# Patient Record
Sex: Female | Born: 1960 | Race: White | Hispanic: Refuse to answer | Marital: Married | Smoking: Former smoker
Health system: Southern US, Community
[De-identification: ages and names within clinical notes are randomized; demographics above are authoritative.]

## PROBLEM LIST (undated history)

## (undated) DIAGNOSIS — E7800 Pure hypercholesterolemia, unspecified: Secondary | ICD-10-CM

## (undated) DIAGNOSIS — E119 Type 2 diabetes mellitus without complications: Secondary | ICD-10-CM

## (undated) DIAGNOSIS — T7840XA Allergy, unspecified, initial encounter: Secondary | ICD-10-CM

## (undated) HISTORY — DX: Allergy, unspecified, initial encounter: T78.40XA

---

## 1983-09-06 HISTORY — PX: LAPAROSCOPY: SHX197

## 2017-08-01 ENCOUNTER — Other Ambulatory Visit: Payer: Self-pay

## 2017-08-01 ENCOUNTER — Ambulatory Visit
Admission: EM | Admit: 2017-08-01 | Discharge: 2017-08-01 | Disposition: A | Discharge status: Home / Self Care | Payer: BLUE CROSS/BLUE SHIELD | Attending: Emergency Medicine | Admitting: Emergency Medicine

## 2017-08-01 ENCOUNTER — Ambulatory Visit (INDEPENDENT_AMBULATORY_CARE_PROVIDER_SITE_OTHER): Payer: BLUE CROSS/BLUE SHIELD

## 2017-08-01 DIAGNOSIS — R05 Cough: Secondary | ICD-10-CM

## 2017-08-01 DIAGNOSIS — J209 Acute bronchitis, unspecified: Secondary | ICD-10-CM

## 2017-08-01 DIAGNOSIS — J0100 Acute maxillary sinusitis, unspecified: Secondary | ICD-10-CM | POA: Diagnosis not present

## 2017-08-01 MED ORDER — IPRATROPIUM-ALBUTEROL 0.5-2.5 (3) MG/3ML IN SOLN
3.0000 mL | Freq: Once | RESPIRATORY_TRACT | Status: AC
  Administered 2017-08-01: 3 mL via RESPIRATORY_TRACT

## 2017-08-01 MED ORDER — AEROCHAMBER PLUS MISC: 2 refills | Status: DC

## 2017-08-01 MED ORDER — DOXYCYCLINE HYCLATE 100 MG PO CAPS: 100.0000 mg | ORAL_CAPSULE | Freq: Two times a day (BID) | ORAL | 0 refills | Status: AC

## 2017-08-01 MED ORDER — ALBUTEROL SULFATE HFA 108 (90 BASE) MCG/ACT IN AERS: 1.0000 | INHALATION_SPRAY | Freq: Four times a day (QID) | RESPIRATORY_TRACT | 0 refills | Status: DC | PRN

## 2017-08-01 MED ORDER — BENZONATATE 200 MG PO CAPS: 200.0000 mg | ORAL_CAPSULE | Freq: Three times a day (TID) | ORAL | 0 refills | Status: DC | PRN

## 2017-08-01 MED ORDER — FLUTICASONE PROPIONATE 50 MCG/ACT NA SUSP: 2.0000 | Freq: Every day | NASAL | 0 refills | Status: DC

## 2017-08-01 NOTE — ED Triage Notes (Signed)
Patient complains of cough and heavy mucus productivity. Patient states that symptoms started around 1 month ago and she thought she was improving and worsened again around 1 week ago. Patient states that she feels like she has been wheezing recently.

## 2017-08-01 NOTE — Discharge Instructions (Signed)
Take the medication as written. Return if you get worse, have a fever >100.4, or for any concerns. You may take 600 mg of motrin with 1 gram of tylenol up to 3-4 times a day as needed for pain. This is an effective combination for pain. Use a neti pot or the NeilMed sinus rinse as often as you want to to reduce nasal congestion. Follow the directions on the box.   Here is a list of primary care providers who are taking new patients:  Dr. Elizabeth Sauereanna Jones, Dr. Schuyler AmorWilliam Plonk [address] [place] [address] [phone]  [facility] [place] [address]  [place] [address]  [phone]  [facility] [address]  [place], [place] [address] [phone]  [facility] [place] [address]  [phone] [place], [place] [address]  Here are clinics/ other resources who will see you if you do not have insurance. Some have certain criteria that you must meet. Call them and find out what they are:  [facility]: [address] [place], [place] [address] Phone: [phone] Hours: First and Third Saturdays of each Month, 9 a.m. - 1 p.m.  [facility]: [address], [place], [place] [address] Phone: [phone] Hours: Tuesday, 4 p.m. - 8 p.m. Thursday, 1 p.m. - 8 p.m. Wednesday, 9 a.m. - [facility]  [facility] [address], [place], [place] [address] Phone: [phone] Pharmacy Phone Number: [phone] Dental Phone Number: [phone] [facility] Insurance Help: [phone]  Dental Hours: Monday - Thursday, 8 a.m. - 6 p.m.  [facility] [address] [place], [place] [address] Phone: [phone] Pharmacy Phone Number: [phone] [facility] Insurance Help: [phone]  [facility] [address] [place], [place] [address] Phone: [phone] Pharmacy Phone Number: [phone] [facility] Insurance Help: [phone]  [facility] [address]  [place], [place] [address] Phone: [phone] [facility] Insurance Help: [phone]   [facility] [address] [place], [place] [address] Phone: [phone]  Go to www.goodrx.com to look up your medications. This will give you a list of where you can find your prescriptions at the most affordable prices. Or ask the pharmacist what the cash price is, or if they have any other discount programs available to help make your medication more affordable. This can be less expensive than what you would pay with insurance.

## 2017-08-01 NOTE — ED Provider Notes (Signed)
HPI  SUBJECTIVE:  Cassidy Wood is a 56 y.o. female who presents with 1 month of chest congestion, cough, nasal congestion, rhinorrhea, postnasal drip.  Patient states that she was getting better and symptoms had almost completely resolved and then got worse several days ago.  She reports greenish yellowish nasal discharge and sputum.  She reports increased sputum production.  She reports wheezing with coughing, chest soreness, rhinorrhea, postnasal drip.  No hemoptysis, fevers, other chest pain, shortness of breath.  No upper dental pain, sinus pain or pressure.  No unintentional weight loss, unintentional weight gain, lower extremity edema, nocturia, orthopnea, PND, abdominal pain.  No dyspnea on exertion, GERD symptoms.  She tried a nasal strip, DayQuil, Vicks, cough drops which were working up until yesterday.  No aggravating factors.  She is a smoker, has been smoking for 30 years.  No history of GERD, asthma, emphysema, COPD, CHF, diabetes, hypertension.  She does have history of sinusitis.  [place] In [place].  States that they live here 6-9 months out of the year.  History reviewed. No pertinent past medical history.  Past Surgical History:  Procedure Laterality Date  . LAPAROSCOPY  1985    Family History  Problem Relation Age of Onset  . Diabetes Mother   . COPD Father     Social History   Tobacco Use  . Smoking status: Current Every Day Smoker    Packs/day: 0.10  . Smokeless tobacco: Never Used  Substance Use Topics  . Alcohol use: No    Frequency: Never  . Drug use: No    No current facility-administered medications for this encounter.   Current Outpatient Medications:  .  albuterol (PROVENTIL HFA;VENTOLIN HFA) 108 (90 Base) MCG/ACT inhaler, Inhale 1-2 puffs into the lungs every 6 (six) hours as needed for wheezing or shortness of breath., Disp: 1 Inhaler, Rfl: 0 .  benzonatate (TESSALON) 200 MG capsule, Take 1 capsule (200 mg total) by mouth 3 (three) times daily as  needed for cough., Disp: 30 capsule, Rfl: 0 .  doxycycline (VIBRAMYCIN) 100 MG capsule, Take 1 capsule (100 mg total) by mouth 2 (two) times daily for 7 days., Disp: 14 capsule, Rfl: 0 .  fluticasone (FLONASE) 50 MCG/ACT nasal spray, Place 2 sprays into both nostrils daily., Disp: 16 g, Rfl: 0 .  Spacer/Aero-Holding Chambers (AEROCHAMBER PLUS) inhaler, Use as instructed, Disp: 1 each, Rfl: 2  Allergies  Allergen Reactions  . Penicillins Swelling     ROS  As noted in HPI.   Physical Exam  BP 125/65 (BP Location: Right Arm)   Pulse 81   Temp 99 F (37.2 C) (Oral)   Resp 18   Ht 5\' 4"  (1.626 m)   Wt 140 lb (63.5 kg)   SpO2 96%   BMI 24.03 kg/m   Constitutional: Well developed, well nourished, no acute distress Eyes:  EOMI, conjunctiva normal bilaterally HENT: Normocephalic, atraumatic, mucus membranes moist.  Positive nasal congestion, maxillary sinus tenderness.  No frontal sinus tenderness.  Positive postnasal drip. Respiratory: Normal inspiratory effort wheezing left side with occasional rhonchi.  Positive chest wall tenderness.  Fair air movement. Cardiovascular: Normal rate and rhythm no murmur rubs or gallops GI: nondistended skin: No rash, skin intact Musculoskeletal: no deformities Neurologic: Alert & oriented x 3, no focal neuro deficits Psychiatric: Speech and behavior appropriate   ED Course   Medications  ipratropium-albuterol (DUONEB) 0.5-2.5 (3) MG/3ML nebulizer solution 3 mL (3 mLs Nebulization Given 08/01/17 0920)    Orders Placed This  Encounter  Procedures  . DG Chest 2 View    Standing Status:   Standing    Number of Occurrences:   1    Order Specific Question:   Reason for Exam (SYMPTOM  OR DIAGNOSIS REQUIRED)    Answer:   cough x 1 month smoker r/o pna effusion mass pulm edema    No results found for this or any previous visit (from the past 24 hour(s)). Dg Chest 2 View  Result Date: 08/01/2017 CLINICAL DATA:  Pt states she has been  congested with prod cough x 1 month with midsternal chest tenderness, chills yesterday/hot flashes. Current smoker. EXAM: CHEST  2 VIEW COMPARISON:  None. FINDINGS: The heart size and mediastinal contours are within normal limits. Both lungs are clear. The visualized skeletal structures are unremarkable. IMPRESSION: No active cardiopulmonary disease. Electronically Signed   By: Elige KoHetal  Patel   On: 08/01/2017 09:46    ED Clinical Impression  Acute bronchitis, unspecified organism  Acute non-recurrent maxillary sinusitis   ED Assessment/Plan  Giving DuoNeb and will check chest x-ray given duration of symptoms to rule out mass, edema, effusion, pneumonia.  Suspect sinusitis with accompanying bronchitis.  Given the history of double sickening, will send home with doxycycline, 1-2 puffs of albuterol inhaler with a spacer every 4-6 hours, Flonase, saline nasal irrigation, advised patient to start Mucinex D.  Ordering primary care follow-up will provide primary care referral list. she will follow-up with a primary care physician of her choice.  To the ER if she gets worse.  Reviewed imaging independently.  Normal chest x-ray.  See radiology report for full details.  Reevaluation, patient states that she feels better.  Improved air movement, faint scattered wheezing bilaterally.  No rales or ronchi. Plan as above.  Discussed imaging, MDM, plan and followup with patient. Discussed sn/sx that should prompt return to the ED. patient agrees with plan.   Meds ordered this encounter  Medications  . ipratropium-albuterol (DUONEB) 0.5-2.5 (3) MG/3ML nebulizer solution 3 mL  . doxycycline (VIBRAMYCIN) 100 MG capsule    Sig: Take 1 capsule (100 mg total) by mouth 2 (two) times daily for 7 days.    Dispense:  14 capsule    Refill:  0  . albuterol (PROVENTIL HFA;VENTOLIN HFA) 108 (90 Base) MCG/ACT inhaler    Sig: Inhale 1-2 puffs into the lungs every 6 (six) hours as needed for wheezing or shortness of  breath.    Dispense:  1 Inhaler    Refill:  0  . Spacer/Aero-Holding Chambers (AEROCHAMBER PLUS) inhaler    Sig: Use as instructed    Dispense:  1 each    Refill:  2  . benzonatate (TESSALON) 200 MG capsule    Sig: Take 1 capsule (200 mg total) by mouth 3 (three) times daily as needed for cough.    Dispense:  30 capsule    Refill:  0  . fluticasone (FLONASE) 50 MCG/ACT nasal spray    Sig: Place 2 sprays into both nostrils daily.    Dispense:  16 g    Refill:  0    *This clinic note was created using Scientist, clinical (histocompatibility and immunogenetics)Dragon dictation software. Therefore, there may be occasional mistakes despite careful proofreading.   ?   Domenick GongMortenson, Mikell Camp, MD 08/01/17 1200

## 2018-07-01 IMAGING — CR DG CHEST 2V
2 series · 2 of 2 positions shown · non-contrast
Comparison: None.

CLINICAL DATA: Pt states she has been congested with Sengoku cough x 1
month with midsternal chest tenderness, chills yesterday/hot
flashes. Current smoker.

EXAM:
CHEST  2 VIEW

[chest pa]
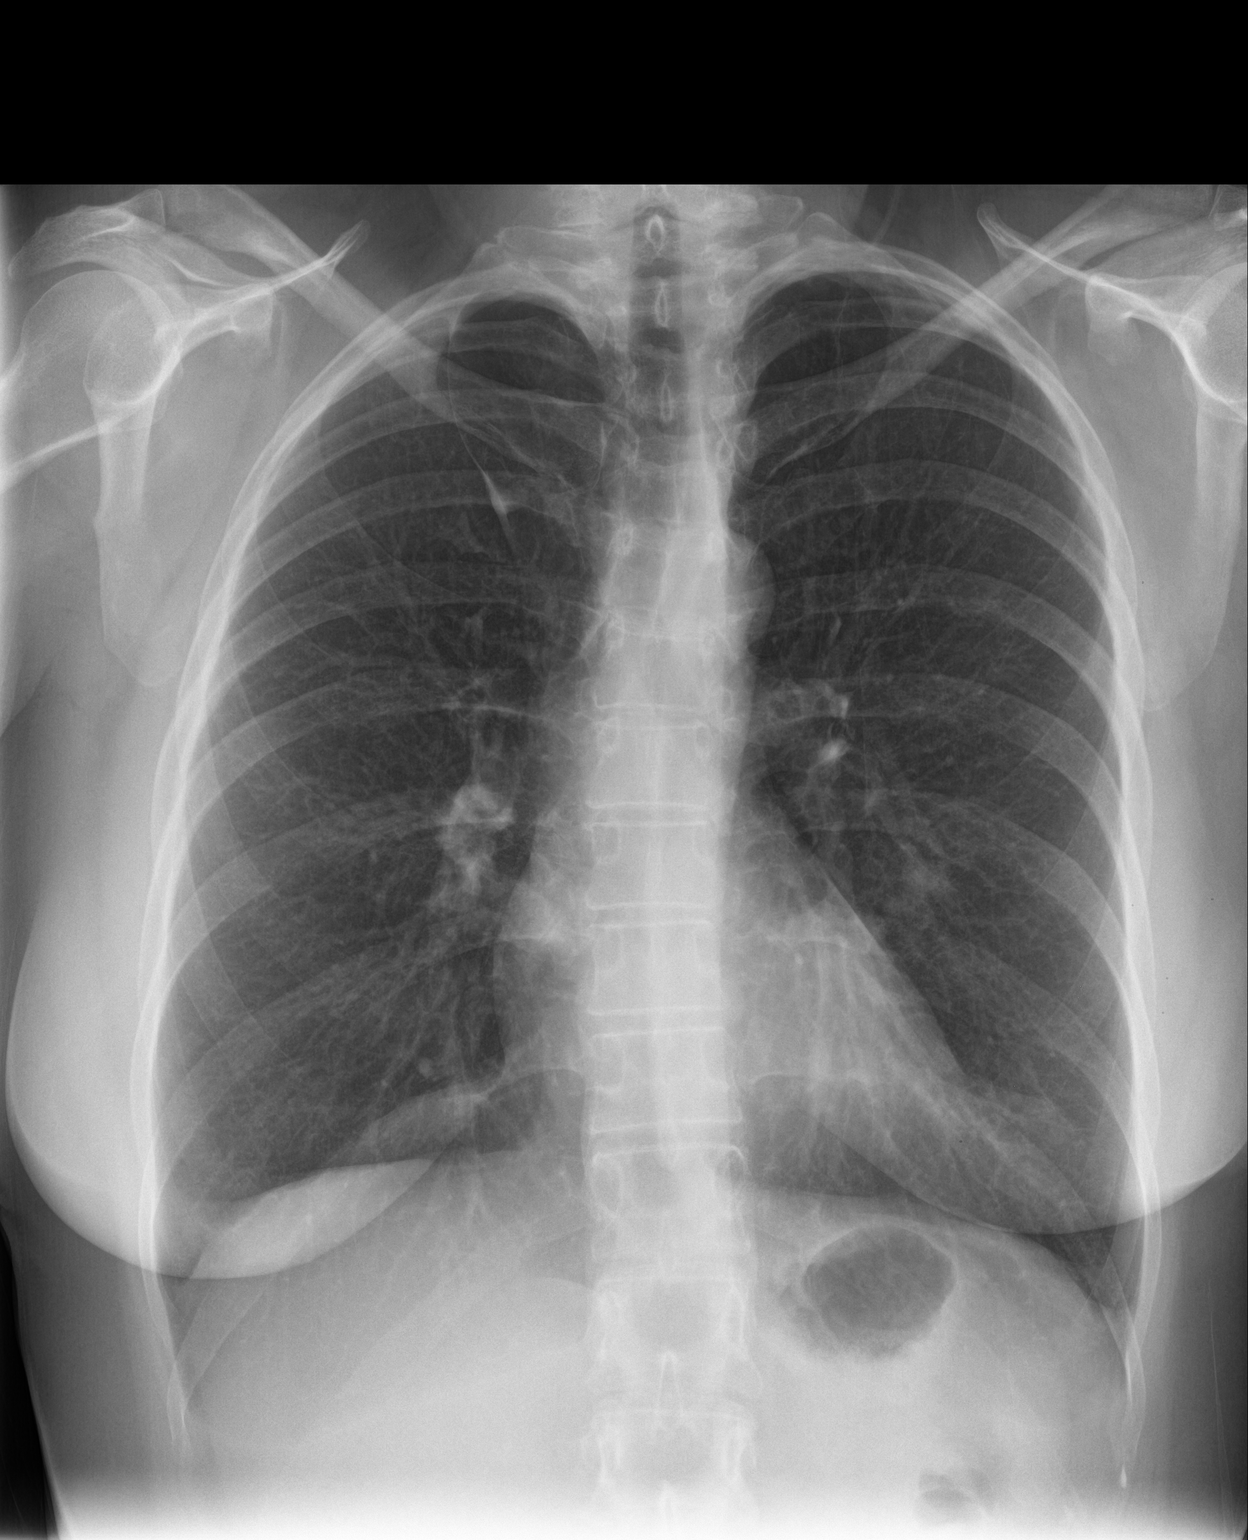

[chest lat]
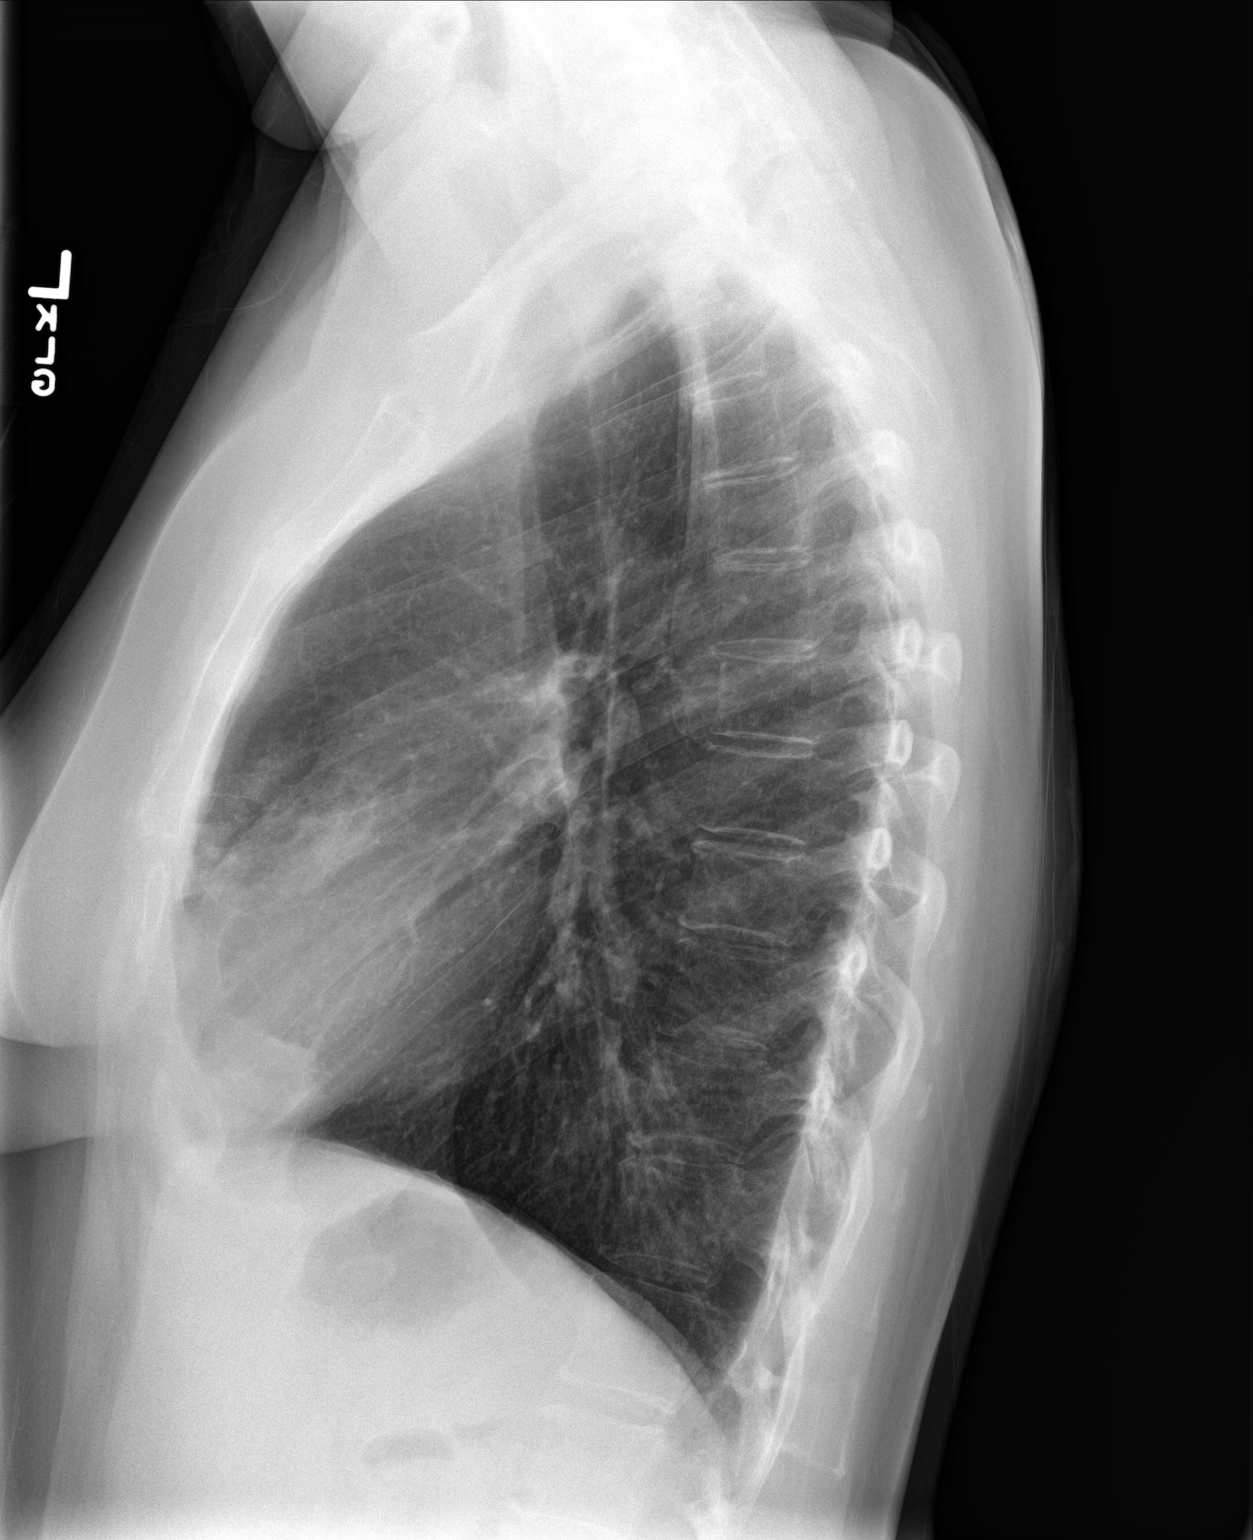

[2 of 2 positions shown; findings below may reference images not displayed]

FINDINGS: The heart size and mediastinal contours are within normal limits.
Both lungs are clear. The visualized skeletal structures are
unremarkable.
IMPRESSION: No active cardiopulmonary disease.

## 2019-07-19 LAB — HEPATIC FUNCTION PANEL
ALT: 16 U/L (ref 7–35)
AST: 17 (ref 13–35)
Alkaline Phosphatase: 57 (ref 25–125)
Bilirubin, Total: 0.5

## 2019-07-19 LAB — CBC AND DIFFERENTIAL
HCT: 40 (ref 36–46)
Hemoglobin: 13.6 (ref 12.0–16.0)
Platelets: 277 10*3/uL (ref 150–400)
WBC: 7.3

## 2019-07-19 LAB — BASIC METABOLIC PANEL
CO2: 25 — AB (ref 13–22)
Chloride: 106 (ref 99–108)
Creatinine: 0.8 (ref 0.5–1.1)
Glucose: 101
Potassium: 4 mEq/L (ref 3.5–5.1)
Sodium: 141 (ref 137–147)

## 2019-07-19 LAB — LIPID PANEL
Cholesterol: 134 (ref 0–200)
HDL: 55 (ref 35–70)
LDL Cholesterol: 64
Triglycerides: 75 (ref 40–160)

## 2019-07-19 LAB — COMPREHENSIVE METABOLIC PANEL
Albumin: 4.8 (ref 3.5–5.0)
Calcium: 9.7 (ref 8.7–10.7)
Globulin: 2.4
eGFR: 79

## 2019-07-19 LAB — HEMOGLOBIN A1C: Hemoglobin A1C: 6

## 2019-07-19 LAB — TSH: TSH: 3.57 (ref 0.41–5.90)

## 2019-07-19 LAB — HM MAMMOGRAPHY

## 2019-07-19 LAB — CBC: RBC: 4.69 (ref 3.87–5.11)

## 2019-09-25 ENCOUNTER — Encounter: Payer: Self-pay | Admitting: Emergency Medicine

## 2019-09-25 ENCOUNTER — Other Ambulatory Visit: Payer: Self-pay

## 2019-09-25 ENCOUNTER — Ambulatory Visit
Admission: EM | Admit: 2019-09-25 | Discharge: 2019-09-25 | Disposition: A | Discharge status: Home / Self Care | Payer: Self-pay | Attending: Family Medicine | Admitting: Family Medicine

## 2019-09-25 DIAGNOSIS — L03031 Cellulitis of right toe: Secondary | ICD-10-CM

## 2019-09-25 HISTORY — DX: Type 2 diabetes mellitus without complications: E11.9

## 2019-09-25 HISTORY — DX: Pure hypercholesterolemia, unspecified: E78.00

## 2019-09-25 MED ORDER — MUPIROCIN 2 % EX OINT: TOPICAL_OINTMENT | CUTANEOUS | 0 refills | Status: DC

## 2019-09-25 MED ORDER — DOXYCYCLINE HYCLATE 100 MG PO CAPS: 100.0000 mg | ORAL_CAPSULE | Freq: Two times a day (BID) | ORAL | 0 refills | Status: AC

## 2019-09-25 NOTE — ED Provider Notes (Signed)
[facility] ____________________________________________  Time seen: Approximately 1:50 PM  I have reviewed the triage vital signs and the nursing notes.   HISTORY  Chief Complaint Toe Pain   HPI Cassidy Wood is a 59 y.o. female presenting for evaluation of redness noted to her right middle toe present for the last few days.  States the area feels tight and swollen.  Denies any other pain.  Denies paresthesias, decreased range of motion.  Denies injury or direct trauma.  Denies no break in skin.  States she is a diabetic which prompted her to come in soon.  Reports tetanus immunizations up-to-date.  Ports otherwise doing well.  No recent fever or sickness.   Past Medical History:  Diagnosis Date  . Diabetes mellitus without complication ([place])   . Hypercholesterolemia     There are no problems to display for this patient.   Past Surgical History:  Procedure Laterality Date  . LAPAROSCOPY  1985     No current facility-administered medications for this encounter.  Current Outpatient Medications:  .  albuterol (PROVENTIL HFA;VENTOLIN HFA) 108 (90 Base) MCG/ACT inhaler, Inhale 1-2 puffs into the lungs every 6 (six) hours as needed for wheezing or shortness of breath., Disp: 1 Inhaler, Rfl: 0 .  metFORMIN (GLUCOPHAGE) 500 MG tablet, Take 500 mg by mouth daily with breakfast., Disp: , Rfl:  .  doxycycline (VIBRAMYCIN) 100 MG capsule, Take 1 capsule (100 mg total) by mouth 2 (two) times daily for 7 days., Disp: 14 capsule, Rfl: 0 .  mupirocin ointment (BACTROBAN) 2 %, Apply two times a day for 7 days., Disp: 22 g, Rfl: 0  Allergies Penicillins  Family History  Problem Relation Age of Onset  . Diabetes Mother   . COPD Father     Social History Social History   Tobacco Use  . Smoking status: Current Every Day Smoker    Packs/day: 0.10  . Smokeless tobacco: Never Used  Substance Use Topics  . Alcohol use: No  . Drug use: No    Review of  Systems Constitutional: No fever ENT: No sore throat. Cardiovascular: Denies chest pain. Respiratory: Denies shortness of breath. Skin: As above.  Neurological: Negative for  focal weakness or numbness.   ____________________________________________   PHYSICAL EXAM:  VITAL SIGNS: ED Triage Vitals  Enc Vitals Group     BP 09/25/19 1230 (!) 152/75     Pulse Rate 09/25/19 1230 81     Resp 09/25/19 1230 18     Temp 09/25/19 1230 98.4 F (36.9 C)     Temp Source 09/25/19 1230 Oral     SpO2 09/25/19 1230 100 %     Weight --      Height 09/25/19 1227 5\' 3"  (1.6 m)     Head Circumference --      Peak Flow --      Pain Score 09/25/19 1227 0     Pain Loc --      Pain Edu? --      Excl. in [place]? --     Constitutional: Alert and oriented. Well appearing and in no acute distress. Eyes: Conjunctivae are normal.  ENT      Head: Normocephalic and atraumatic. Musculoskeletal:  Steady gait.  Neurologic:  Normal speech and language. No gross focal neurologic deficits are appreciated. Speech is normal. No gait instability.  Skin:  Skin is warm, dry. Except: right distal middle toe localized erythema and mild edema, no drainage, minimal distal tenderness only, no bony tenderness,  no drainage, normal distal sensation and capillary refill. Psychiatric: Mood and affect are normal. Speech and behavior are normal. Patient exhibits appropriate insight and judgment   ___________________________________________   LABS (all labs ordered are listed, but only abnormal results are displayed)  Labs Reviewed - No data to display ____________________________________________   PROCEDURES Procedures   INITIAL IMPRESSION / ASSESSMENT AND PLAN / ED COURSE  Pertinent labs & imaging results that were available during my care of the patient were reviewed by me and considered in my medical decision making (see chart for details).  Well-appearing patient.  No acute distress.  Right third toe cellulitis.   Will treat with doxycycline and topical Bactroban.  Discussed supportive care and monitoring.  Discussed follow up and return parameters including no resolution or any worsening concerns. Patient verbalized understanding and agreed to plan.   ____________________________________________   FINAL CLINICAL IMPRESSION(S) / ED DIAGNOSES  Final diagnoses:  Cellulitis of middle toe, right     ED Discharge Orders         Ordered    doxycycline (VIBRAMYCIN) 100 MG capsule  2 times daily     09/25/19 1252    mupirocin ointment (BACTROBAN) 2 %     09/25/19 1252           Note: This dictation was prepared with Dragon dictation along with smaller phrase technology. Any transcriptional errors that result from this process are unintentional.         Renford Dills, NP 09/25/19 [phone]

## 2019-09-25 NOTE — Discharge Instructions (Addendum)
Take medication as prescribed. Keep clean. Monitor.  ° °Follow up with your primary care physician this week as needed. Return to Urgent care for new or worsening concerns.  ° °

## 2019-09-25 NOTE — ED Triage Notes (Signed)
Patient c/o right middle toe swelling and redness that started 2 days ago. Denies injury.

## 2019-10-09 ENCOUNTER — Other Ambulatory Visit: Payer: Self-pay

## 2019-10-09 ENCOUNTER — Ambulatory Visit
Admission: EM | Admit: 2019-10-09 | Discharge: 2019-10-09 | Disposition: A | Discharge status: Home / Self Care | Payer: Self-pay | Attending: Family Medicine | Admitting: Family Medicine

## 2019-10-09 DIAGNOSIS — M79674 Pain in right toe(s): Secondary | ICD-10-CM

## 2019-10-09 DIAGNOSIS — M79675 Pain in left toe(s): Secondary | ICD-10-CM

## 2019-10-09 NOTE — ED Provider Notes (Signed)
[facility]    CSN: [id number] Arrival date & time: 10/09/19  8242      History   Chief Complaint Chief Complaint  Patient presents with  . Cellulitis    HPI  59 year old female presents with the above complaint.  Patient was seen here on 1/20.  Was treated for cellulitis of the right middle toe.  Patient states that the redness and swelling seem to improve but then she reports recurrence after stopping the doxycycline.  She has been continuing to apply mupirocin.  She states that now she has several other toes on the right foot which are red.  She also has toes of the left foot which are red.  She states that they are not swollen but she has a "burning sensation" to the toes.  To the toes.  Rates her pain as 2/10 in severity.  She has been applying Bactroban ointment without resolution.  Patient reports recent injury to one of her toes of the right foot.  No other known exacerbating factors.  No other complaints.  Past Medical History:  Diagnosis Date  . Diabetes mellitus without complication ([facility])   . Hypercholesterolemia    Past Surgical History:  Procedure Laterality Date  . LAPAROSCOPY  1985    OB History   No obstetric history on file.      Home Medications    Prior to Admission medications   Medication Sig Start Date End Date Taking? Authorizing Provider  albuterol (PROVENTIL HFA;VENTOLIN HFA) 108 (90 Base) MCG/ACT inhaler Inhale 1-2 puffs into the lungs every 6 (six) hours as needed for wheezing or shortness of breath. 08/01/17  Yes Domenick Gong, MD  metFORMIN (GLUCOPHAGE) 500 MG tablet Take 500 mg by mouth daily with breakfast.   Yes [provider]  mupirocin ointment (BACTROBAN) 2 % Apply two times a day for 7 days. 09/25/19  Yes Renford Dills, NP  vitamin B-12 (CYANOCOBALAMIN) 1000 MCG tablet Take 1,000 mcg by mouth daily.   Yes [provider]  fluticasone (FLONASE) 50 MCG/ACT nasal spray Place 2 sprays into both nostrils  daily. 08/01/17 09/25/19  Domenick Gong, MD    Family History Family History  Problem Relation Age of Onset  . Diabetes Mother   . COPD Father     Social History Social History   Tobacco Use  . Smoking status: Former Smoker    Quit date: 08/08/2018    Years since quitting: 1.1  . Smokeless tobacco: Never Used  Substance Use Topics  . Alcohol use: No  . Drug use: No     Allergies   Penicillins   Review of Systems Review of Systems  Constitutional: Negative.   Musculoskeletal:       Toe pain and redness.   Physical Exam Triage Vital Signs ED Triage Vitals  Enc Vitals Group     BP 10/09/19 0856 107/81     Pulse Rate 10/09/19 0856 92     Resp 10/09/19 0856 18     Temp 10/09/19 0856 98.5 F (36.9 C)     Temp Source 10/09/19 0856 Oral     SpO2 10/09/19 0856 100 %     Weight 10/09/19 0854 148 lb (67.1 kg)     Height 10/09/19 0854 5\' 3"  (1.6 m)     Head Circumference --      Peak Flow --      Pain Score 10/09/19 0853 2     Pain Loc --      Pain Edu? --  Excl. in [place]? --    Updated Vital Signs BP 107/81 (BP Location: Left Arm)   Pulse 92   Temp 98.5 F (36.9 C) (Oral)   Resp 18   Ht 5\' 3"  (1.6 m)   Wt 67.1 kg   SpO2 100%   BMI 26.22 kg/m   Visual Acuity Right Eye Distance:   Left Eye Distance:   Bilateral Distance:    Right Eye Near:   Left Eye Near:    Bilateral Near:     Physical Exam Vitals and nursing note reviewed.  Constitutional:      General: She is not in acute distress.    Appearance: Normal appearance.  HENT:     Head: Normocephalic and atraumatic.  Eyes:     General:        Right eye: No discharge.        Left eye: No discharge.     Conjunctiva/sclera: Conjunctivae normal.  Pulmonary:     Effort: Pulmonary effort is normal. No respiratory distress.  Skin:    Comments: Right and left feet -patient with several toes that have erythema distally.  No warmth.  No drainage.  Neurovascularly intact distally.  Neurological:      Mental Status: She is alert.  Psychiatric:        Mood and Affect: Mood normal.        Behavior: Behavior normal.    UC Treatments / Results  Labs (all labs ordered are listed, but only abnormal results are displayed) Labs Reviewed - No data to display  EKG   Radiology No results found.  Procedures Procedures (including critical care time)  Medications Ordered in UC Medications - No data to display  Initial Impression / Assessment and Plan / UC Course  I have reviewed the triage vital signs and the nursing notes.  Pertinent labs & imaging results that were available during my care of the patient were reviewed by me and considered in my medical decision making (see chart for details).    59 year old female presents with bilateral toe pain and redness.  Etiology unclear at this time.  There is no evidence of cellulitis.  Advise close monitoring.  Advised to see podiatry.  Information given.  Final Clinical Impressions(s) / UC Diagnoses   Final diagnoses:  Toe pain, bilateral     Discharge Instructions     No evidence of Cellulitis.   Could be Raynaud's.   See Podiatry.  Take care  Dr. Lacinda Axon    ED Prescriptions    None     PDMP not reviewed this encounter.   Coral Spikes, [place] 10/09/19 [phone]

## 2019-10-09 NOTE — ED Triage Notes (Addendum)
Patient states that she was seen here on 1/20 for cellulitis, reports that she took mupirocen and doxycycline. Patient states that she injured her right foot last week and now redness is spreading again. Patient reports that she is a diabetic.

## 2019-10-09 NOTE — Discharge Instructions (Signed)
No evidence of Cellulitis.   Could be Raynaud's.   See Podiatry.  Take care  Dr. Adriana Simas

## 2019-11-22 ENCOUNTER — Ambulatory Visit: Payer: Self-pay | Attending: Internal Medicine

## 2019-11-22 DIAGNOSIS — Z23 Encounter for immunization: Secondary | ICD-10-CM

## 2019-11-22 NOTE — Progress Notes (Signed)
   Covid-19 Vaccination Clinic  Name:  Cassidy Wood    MRN: [id number] DOB: 20-Dec-1960  11/22/2019  [name] was observed post Covid-19 immunization for 15 minutes without incident. She was provided with Vaccine Information Sheet and instruction to access the V-Safe system.   [name] was instructed to call 911 with any severe reactions post vaccine: Marland Kitchen Difficulty breathing  . Swelling of face and throat  . A fast heartbeat  . A bad rash all over body  . Dizziness and weakness   Immunizations Administered    Name Date Dose VIS Date Route   Pfizer COVID-19 Vaccine 11/22/2019  9:51 AM 0.3 mL 08/16/2019 Intramuscular   Manufacturer: [employer], [employer]   Lot: [id number]   NDC: [address]-9

## 2019-12-17 ENCOUNTER — Ambulatory Visit: Payer: Self-pay | Attending: Internal Medicine

## 2019-12-17 DIAGNOSIS — Z23 Encounter for immunization: Secondary | ICD-10-CM

## 2019-12-17 NOTE — Progress Notes (Signed)
   Covid-19 Vaccination Clinic  Name:  Latonyia Lamaster    MRN: [id number] DOB: 11-10-1960  12/17/2019  Ms. Plyler was observed post Covid-19 immunization for 15 minutes without incident. She was provided with Vaccine Information Sheet and instruction to access the V-Safe system.   Ms. Shugrue was instructed to call 911 with any severe reactions post vaccine: Marland Kitchen Difficulty breathing  . Swelling of face and throat  . A fast heartbeat  . A bad rash all over body  . Dizziness and weakness   Immunizations Administered    Name Date Dose VIS Date Route   Pfizer COVID-19 Vaccine 12/17/2019  9:41 AM 0.3 mL 08/16/2019 Intramuscular   Manufacturer: [employer], [employer]   Lot: [id number]   NDC: [address]-7

## 2022-01-25 ENCOUNTER — Ambulatory Visit
Admission: RE | Admit: 2022-01-25 | Discharge: 2022-01-25 | Disposition: A | Discharge status: Home / Self Care | Payer: 59 | Source: Ambulatory Visit | Attending: Physician Assistant | Admitting: Physician Assistant

## 2022-01-25 VITALS — BP 139/69 | HR 68 | Temp 97.9°F | Resp 18 | Ht 64.0 in | Wt 147.0 lb

## 2022-01-25 DIAGNOSIS — E119 Type 2 diabetes mellitus without complications: Secondary | ICD-10-CM

## 2022-01-25 DIAGNOSIS — Z760 Encounter for issue of repeat prescription: Secondary | ICD-10-CM

## 2022-01-25 DIAGNOSIS — Z7984 Long term (current) use of oral hypoglycemic drugs: Secondary | ICD-10-CM

## 2022-01-25 LAB — CBC WITH DIFFERENTIAL/PLATELET
Abs Immature Granulocytes: 0.02 10*3/uL (ref 0.00–0.07)
Basophils Absolute: 0.1 10*3/uL (ref 0.0–0.1)
Basophils Relative: 1 %
Eosinophils Absolute: 0.1 10*3/uL (ref 0.0–0.5)
Eosinophils Relative: 2 %
HCT: 40.5 % (ref 36.0–46.0)
Hemoglobin: 13.6 g/dL (ref 12.0–15.0)
Immature Granulocytes: 0 %
Lymphocytes Relative: 32 %
Lymphs Abs: 2.3 10*3/uL (ref 0.7–4.0)
MCH: 28.9 pg (ref 26.0–34.0)
MCHC: 33.6 g/dL (ref 30.0–36.0)
MCV: 86.2 fL (ref 80.0–100.0)
Monocytes Absolute: 0.5 10*3/uL (ref 0.1–1.0)
Monocytes Relative: 7 %
Neutro Abs: 4.3 10*3/uL (ref 1.7–7.7)
Neutrophils Relative %: 58 %
Platelets: 262 10*3/uL (ref 150–400)
RBC: 4.7 MIL/uL (ref 3.87–5.11)
RDW: 13.2 % (ref 11.5–15.5)
WBC: 7.3 10*3/uL (ref 4.0–10.5)
nRBC: 0 % (ref 0.0–0.2)

## 2022-01-25 LAB — URINALYSIS, ROUTINE W REFLEX MICROSCOPIC
Bilirubin Urine: NEGATIVE
Glucose, UA: NEGATIVE mg/dL
Hgb urine dipstick: NEGATIVE
Ketones, ur: NEGATIVE mg/dL
Leukocytes,Ua: NEGATIVE
Nitrite: NEGATIVE
Protein, ur: NEGATIVE mg/dL
Specific Gravity, Urine: 1.015 (ref 1.005–1.030)
pH: 8.5 — ABNORMAL HIGH (ref 5.0–8.0)

## 2022-01-25 LAB — COMPREHENSIVE METABOLIC PANEL
ALT: 24 U/L (ref 0–44)
AST: 23 U/L (ref 15–41)
Albumin: 4.1 g/dL (ref 3.5–5.0)
Alkaline Phosphatase: 52 U/L (ref 38–126)
Anion gap: 6 (ref 5–15)
BUN: 19 mg/dL (ref 6–20)
CO2: 27 mmol/L (ref 22–32)
Calcium: 9.5 mg/dL (ref 8.9–10.3)
Chloride: 104 mmol/L (ref 98–111)
Creatinine, Ser: 0.66 mg/dL (ref 0.44–1.00)
GFR, Estimated: >60 mL/min (ref >60)
Glucose, Bld: 104 mg/dL — ABNORMAL HIGH (ref 70–99)
Potassium: 3.9 mmol/L (ref 3.5–5.1)
Sodium: 137 mmol/L (ref 135–145)
Total Bilirubin: 0.7 mg/dL (ref 0.3–1.2)
Total Protein: 7.8 g/dL (ref 6.5–8.1)

## 2022-01-25 LAB — HEMOGLOBIN A1C
Hgb A1c MFr Bld: 6 % — ABNORMAL HIGH (ref 4.8–5.6)
Mean Plasma Glucose: 125.5 mg/dL

## 2022-01-25 MED ORDER — METFORMIN HCL 500 MG PO TABS: 500.0000 mg | ORAL_TABLET | Freq: Every day | ORAL | 3 refills | Status: DC

## 2022-01-25 NOTE — ED Provider Notes (Addendum)
[facility]    CSN: [id number] Arrival date & time: 01/25/22  0904      History   Chief Complaint Chief Complaint  Patient presents with   Medication Refill    Metformin.     HPI Cassidy Wood is a 61 y.o. female presenting for medication refill.  Patient is new to the area.  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  Patient is type II diabetic and was diagnosed in August 2020. Initially at 500 mg Metformin BID, but patient made a lot of lifestyle changes (low-carb, low-fat, and lots of walking ) and was able to reduce A1C from 8.1 to 6.0 so she was dropped from BID to once daily. Has been taking Metformin once daily since December 2020. Last A1C check was May 2022 and was 5.7.  Patient denies any side effects to the medication.  Denies any polydipsia, polyphagia, unintentional weight changes, dizziness, blurred vision, nausea or vomiting.  No chest pain or breathing problem reported. Other history significant for hyperlipidemia. Takes Atorvastatin.  Patient has an appointment with Dr. Ronnald Ramp in the middle of September and would like metformin refill until then.  HPI  Past Medical History:  Diagnosis Date   Diabetes mellitus without complication ([place])    Hypercholesterolemia     There are no problems to display for this patient.   Past Surgical History:  Procedure Laterality Date   LAPAROSCOPY  1985    OB History   No obstetric history on file.      Home Medications    Prior to Admission medications   Medication Sig Start Date End Date Taking? Authorizing Provider  albuterol (PROVENTIL HFA;VENTOLIN HFA) 108 (90 Base) MCG/ACT inhaler Inhale 1-2 puffs into the lungs every 6 (six) hours as needed for wheezing or shortness of breath. 08/01/17  Yes Melynda Ripple, MD  atorvastatin (LIPITOR) 10 MG tablet Take 10 mg by mouth daily. 11/11/21  Yes [provider]  cetirizine (ZYRTEC) 10 MG tablet Take 10 mg by mouth  daily.   Yes [provider]  mupirocin ointment (BACTROBAN) 2 % Apply two times a day for 7 days. 09/25/19  Yes Marylene Land, NP  vitamin B-12 (CYANOCOBALAMIN) 1000 MCG tablet Take 1,000 mcg by mouth daily.   Yes [provider]  metFORMIN (GLUCOPHAGE) 500 MG tablet Take 1 tablet (500 mg total) by mouth daily with breakfast. 01/25/22 02/24/22  Laurene Footman B, PA-C  fluticasone (FLONASE) 50 MCG/ACT nasal spray Place 2 sprays into both nostrils daily. 08/01/17 09/25/19  Melynda Ripple, MD    Family History Family History  Problem Relation Age of Onset   Diabetes Mother    COPD Father     Social History Social History   Tobacco Use   Smoking status: Former    Types: Cigarettes    Quit date: 08/08/2018    Years since quitting: 3.4   Smokeless tobacco: Never  Vaping Use   Vaping Use: Never used  Substance Use Topics   Alcohol use: No   Drug use: No     Allergies   Penicillins   Review of Systems Review of Systems  Constitutional:  Negative for fatigue and unexpected weight change.  Eyes:  Negative for visual disturbance.  Respiratory:  Negative for shortness of breath.   Cardiovascular:  Negative for chest pain.  Gastrointestinal:  Negative for abdominal pain, nausea and vomiting.  Endocrine: Negative for polyphagia and polyuria.  Genitourinary:  Negative for dysuria, frequency and urgency.  Neurological:  Negative for dizziness and weakness.    Physical Exam Triage Vital Signs ED Triage Vitals [01/25/22 0937]  Enc Vitals Group     BP      Pulse      Resp      Temp      Temp src      SpO2      Weight 147 lb (66.7 kg)     Height 5\' 4"  (1.626 m)     Head Circumference      Peak Flow      Pain Score      Pain Loc      Pain Edu?      Excl. in [place]?    No data found.  Updated Vital Signs BP 139/69 (BP Location: Left Arm)   Pulse 68   Temp 97.9 F (36.6 C) (Oral)   Resp 18   Ht 5\' 4"  (1.626 m)   Wt 147 lb (66.7 kg)   SpO2 100%   BMI  25.23 kg/m      Physical Exam Vitals and nursing note reviewed.  Constitutional:      General: She is not in acute distress.    Appearance: Normal appearance. She is not ill-appearing or toxic-appearing.  HENT:     Head: Normocephalic and atraumatic.     Nose: Nose normal.     Mouth/Throat:     Mouth: Mucous membranes are moist.     Pharynx: Oropharynx is clear.  Eyes:     General: No scleral icterus.       Right eye: No discharge.        Left eye: No discharge.     Conjunctiva/sclera: Conjunctivae normal.  Cardiovascular:     Rate and Rhythm: Normal rate and regular rhythm.     Heart sounds: Normal heart sounds.  Pulmonary:     Effort: Pulmonary effort is normal. No respiratory distress.     Breath sounds: Normal breath sounds.  Musculoskeletal:     Cervical back: Neck supple.  Skin:    General: Skin is dry.  Neurological:     General: No focal deficit present.     Mental Status: She is alert. Mental status is at baseline.     Motor: No weakness.     Gait: Gait normal.  Psychiatric:        Mood and Affect: Mood normal.        Behavior: Behavior normal.        Thought Content: Thought content normal.     UC Treatments / Results  Labs (all labs ordered are listed, but only abnormal results are displayed) Labs Reviewed  COMPREHENSIVE METABOLIC PANEL - Abnormal; Notable for the following components:      Result Value   Glucose, Bld 104 (*)    All other components within normal limits  URINALYSIS, ROUTINE W REFLEX MICROSCOPIC - Abnormal; Notable for the following components:   pH 8.5 (*)    All other components within normal limits  CBC WITH DIFFERENTIAL/PLATELET  HEMOGLOBIN A1C    EKG   Radiology No results found.  Procedures Procedures (including critical care time)  Medications Ordered in UC Medications - No data to display  Initial Impression / Assessment and Plan / UC Course  I have reviewed the triage vital signs and the nursing  notes.  Pertinent labs & imaging results that were available during my care of the patient were reviewed by me and  considered in my medical decision making (see chart for details).  61 year old female with type 2 diabetes presents from medication refill of metformin.  Taking 500 mg daily.  Patient also watches her diet and exercises often.  Today her blood sugar is 104.  She says that she ate a couple of hours ago.  Reports eating egg whites, cheese and having a black coffee.  Urinalysis normal.  CBC normal.  All electrolytes and kidney and liver enzymes normal.  Discussed all lab results with patient.  Her vitals are normal and reassuring.  Her exam is normal and reassuring.  I refilled her metformin and for 3 more refills on it to get her through the appointment with Dr. Ronnald Ramp.  Advised her to follow-up here as needed for any acute issues.  We did draw an A1c and I advised her that her results will be available in MyChart later.  We will call her as well.  Final Clinical Impressions(s) / UC Diagnoses   Final diagnoses:  Medication refill  Type 2 diabetes mellitus without complication, without long-term current use of insulin [facility])     Discharge Instructions      -Your labs look great today.  Blood sugar is 104.  I refilled the metformin and put 3 refills on it.  Keep your appointment with Dr. Ronnald Ramp.  Return here sooner for any urgent care needs. - Your A1c should be back in the next day and results will be on your my chart.  Someone should call you and let you know the results. - Continue with your lifestyle modifications and keeping up with your medicine.     ED Prescriptions     Medication Sig Dispense Auth. Provider   metFORMIN (GLUCOPHAGE) 500 MG tablet Take 1 tablet (500 mg total) by mouth daily with breakfast. 30 tablet Danton Clap, PA-C      PDMP not reviewed this encounter.   Danton Clap, PA-C 01/25/22 1059    Laurene Footman B, PA-C 01/25/22 1059

## 2022-01-25 NOTE — ED Triage Notes (Signed)
Pt is asking for a prescription refill on Metformin and states that her Blood sugar is well controlled and her A1c was at 5.7.   Pt sees her new provider September.   Pt is not fasting

## 2022-01-25 NOTE — Discharge Instructions (Signed)
-  Your labs look great today.  Blood sugar is 104.  I refilled the metformin and put 3 refills on it.  Keep your appointment with Dr. Yetta Barre.  Return here sooner for any urgent care needs. - Your A1c should be back in the next day and results will be on your my chart.  Someone should call you and let you know the results. - Continue with your lifestyle modifications and keeping up with your medicine.

## 2022-05-20 ENCOUNTER — Ambulatory Visit (INDEPENDENT_AMBULATORY_CARE_PROVIDER_SITE_OTHER): Payer: 59 | Admitting: Family Medicine

## 2022-05-20 ENCOUNTER — Encounter: Payer: Self-pay | Admitting: Family Medicine

## 2022-05-20 VITALS — BP 138/80 | HR 80 | Ht 64.0 in | Wt 148.0 lb

## 2022-05-20 DIAGNOSIS — Z23 Encounter for immunization: Secondary | ICD-10-CM

## 2022-05-20 DIAGNOSIS — R7303 Prediabetes: Secondary | ICD-10-CM | POA: Diagnosis not present

## 2022-05-20 MED ORDER — METFORMIN HCL 500 MG PO TABS: 500.0000 mg | ORAL_TABLET | Freq: Every day | ORAL | 0 refills | Status: DC

## 2022-05-20 NOTE — Progress Notes (Signed)
Date:  05/20/2022   Name:  Cassidy Wood   DOB:  May 19, 1961   MRN:  [id number]   Chief Complaint: Establish Care, Flu Vaccine, and Diabetes  Diabetes She presents for her follow-up diabetic visit. She has type 2 diabetes mellitus. Her disease course has been stable. There are no hypoglycemic associated symptoms. Pertinent negatives for hypoglycemia include no dizziness, headaches or nervousness/anxiousness. There are no diabetic associated symptoms. Pertinent negatives for diabetes include no fatigue and no weakness. There are no hypoglycemic complications. Symptoms are stable. There are no diabetic complications. There are no known risk factors for coronary artery disease. When asked about current treatments, none were reported. Her weight is stable. She is following a generally healthy diet. Meal planning includes avoidance of concentrated sweets and carbohydrate counting. Her breakfast blood glucose is taken after 10 am. Her breakfast blood glucose range is generally 110-130 mg/dl.    Lab Results  Component Value Date   NA 137 01/25/2022   K 3.9 01/25/2022   CO2 27 01/25/2022   GLUCOSE 104 (H) 01/25/2022   BUN 19 01/25/2022   CREATININE 0.66 01/25/2022   CALCIUM 9.5 01/25/2022   GFRNONAA >60 01/25/2022   No results found for: "CHOL", "HDL", "LDLCALC", "LDLDIRECT", "TRIG", "CHOLHDL" No results found for: "TSH" Lab Results  Component Value Date   HGBA1C 6.0 (H) 01/25/2022   Lab Results  Component Value Date   WBC 7.3 01/25/2022   HGB 13.6 01/25/2022   HCT 40.5 01/25/2022   MCV 86.2 01/25/2022   PLT 262 01/25/2022   Lab Results  Component Value Date   ALT 24 01/25/2022   AST 23 01/25/2022   ALKPHOS 52 01/25/2022   BILITOT 0.7 01/25/2022   No results found for: "25OHVITD2", "25OHVITD3", "VD25OH"   Review of Systems  Constitutional: Negative.  Negative for chills, fatigue, fever and unexpected weight change.  HENT:  Negative for congestion, ear discharge, ear pain,  rhinorrhea, sinus pressure, sneezing and sore throat.   Respiratory:  Negative for cough, shortness of breath, wheezing and stridor.   Gastrointestinal:  Negative for abdominal pain, blood in stool, constipation, diarrhea and nausea.  Genitourinary:  Negative for dysuria, flank pain, frequency, hematuria, urgency and vaginal discharge.  Musculoskeletal:  Negative for arthralgias, back pain and myalgias.  Skin:  Negative for rash.  Neurological:  Negative for dizziness, weakness and headaches.  Hematological:  Negative for adenopathy. Does not bruise/bleed easily.  Psychiatric/Behavioral:  Negative for dysphoric mood. The patient is not nervous/anxious.     There are no problems to display for this patient.   Allergies  Allergen Reactions   Penicillins Swelling    Past Surgical History:  Procedure Laterality Date   LAPAROSCOPY  1985    Social History   Tobacco Use   Smoking status: Former    Types: Cigarettes    Quit date: 08/08/2018    Years since quitting: 3.7   Smokeless tobacco: Never  Vaping Use   Vaping Use: Never used  Substance Use Topics   Alcohol use: Yes    Comment: rarely   Drug use: No     Medication list has been reviewed and updated.  Current Meds  Medication Sig   atorvastatin (LIPITOR) 10 MG tablet Take 10 mg by mouth daily.   cetirizine (ZYRTEC) 10 MG tablet Take 10 mg by mouth daily.   metFORMIN (GLUCOPHAGE) 500 MG tablet Take 1 tablet (500 mg total) by mouth daily with breakfast.   vitamin B-12 (CYANOCOBALAMIN) 1000 MCG tablet  Take 1,000 mcg by mouth daily.       05/20/2022    1:29 PM  GAD 7 : Generalized Anxiety Score  Nervous, Anxious, on Edge 0  Control/stop worrying 0  Worry too much - different things 0  Trouble relaxing 0  Restless 0  Easily annoyed or irritable 0  Afraid - awful might happen 0  Total GAD 7 Score 0  Anxiety Difficulty Not difficult at all       05/20/2022    1:29 PM  Depression screen PHQ 2/9  Decreased  Interest 0  Down, Depressed, Hopeless 0  PHQ - 2 Score 0  Altered sleeping 0  Tired, decreased energy 0  Change in appetite 0  Feeling bad or failure about yourself  0  Trouble concentrating 0  Moving slowly or fidgety/restless 0  Suicidal thoughts 0  PHQ-9 Score 0  Difficult doing work/chores Not difficult at all    BP Readings from Last 3 Encounters:  05/20/22 138/80  01/25/22 139/69  10/09/19 107/81    Physical Exam Vitals and nursing note reviewed.  Constitutional:      Appearance: She is well-developed.  HENT:     Head: Normocephalic.     Right Ear: Tympanic membrane and external ear normal.     Left Ear: Tympanic membrane and external ear normal.  Eyes:     General: Lids are everted, no foreign bodies appreciated. No scleral icterus.       Left eye: No foreign body or hordeolum.     Conjunctiva/sclera: Conjunctivae normal.     Right eye: Right conjunctiva is not injected.     Left eye: Left conjunctiva is not injected.     Pupils: Pupils are equal, round, and reactive to light.  Neck:     Thyroid: No thyromegaly.     Vascular: No JVD.     Trachea: No tracheal deviation.  Cardiovascular:     Rate and Rhythm: Normal rate and regular rhythm.     Heart sounds: Normal heart sounds. No murmur heard.    No friction rub. No gallop.  Pulmonary:     Effort: Pulmonary effort is normal.     Breath sounds: Normal breath sounds.  Abdominal:     General: Bowel sounds are normal.     Palpations: Abdomen is soft.  Musculoskeletal:        General: No tenderness. Normal range of motion.     Cervical back: Neck supple.  Lymphadenopathy:     Cervical: No cervical adenopathy.  Skin:    General: Skin is warm.     Findings: No rash.  Neurological:     Mental Status: She is alert.  Psychiatric:        Mood and Affect: Mood is not anxious or depressed.     Wt Readings from Last 3 Encounters:  05/20/22 148 lb (67.1 kg)  01/25/22 147 lb (66.7 kg)  10/09/19 148 lb (67.1  kg)    BP 138/80   Pulse 80   Ht 5\' 4"  (1.626 m)   Wt 148 lb (67.1 kg)   BMI 25.40 kg/m   Assessment and Plan:   1. Prediabetes Chronic.  Patient initially was diabetic in the 8 range but with diet and metformin has moved into prediabetic range with a last A1c of 6.4.  Patient is doing well with weight loss and diet and we will recheck in 6 to 8 weeks at which time we will repeat not only an A1c will include  a lipid panel to assess her current LDL and microalbuminuria which will determine whether we may need to restart an ARB because she was unable to tolerate an ACE even lisinopril 2.5.  2. Need for immunization against influenza Discussed and administered - Flu Vaccine QUAD 63mo+IM (Fluarix, Fluzone & Alfiuria Quad PF)     Elizabeth Sauer, MD

## 2022-07-05 ENCOUNTER — Other Ambulatory Visit: Payer: Self-pay | Admitting: Family Medicine

## 2022-07-05 ENCOUNTER — Encounter: Payer: Self-pay | Admitting: Family Medicine

## 2022-07-05 ENCOUNTER — Ambulatory Visit (INDEPENDENT_AMBULATORY_CARE_PROVIDER_SITE_OTHER): Payer: 59 | Admitting: Family Medicine

## 2022-07-05 VITALS — BP 128/64 | HR 63 | Ht 64.0 in | Wt 146.0 lb

## 2022-07-05 DIAGNOSIS — E7801 Familial hypercholesterolemia: Secondary | ICD-10-CM

## 2022-07-05 DIAGNOSIS — Z23 Encounter for immunization: Secondary | ICD-10-CM

## 2022-07-05 DIAGNOSIS — R7303 Prediabetes: Secondary | ICD-10-CM

## 2022-07-05 DIAGNOSIS — Z124 Encounter for screening for malignant neoplasm of cervix: Secondary | ICD-10-CM

## 2022-07-05 MED ORDER — METFORMIN HCL 500 MG PO TABS: 500.0000 mg | ORAL_TABLET | Freq: Every day | ORAL | 1 refills | Status: DC

## 2022-07-05 MED ORDER — ATORVASTATIN CALCIUM 10 MG PO TABS: 10.0000 mg | ORAL_TABLET | Freq: Every day | ORAL | 1 refills | Status: DC

## 2022-07-05 NOTE — Addendum Note (Signed)
Addended by: Fredderick Severance on: 07/05/2022 09:39 AM   Modules accepted: Orders

## 2022-07-05 NOTE — Progress Notes (Signed)
Date:  07/05/2022   Name:  Cassidy Wood   DOB:  27-Jan-1961   MRN:  [id number]   Chief Complaint: Hyperlipidemia  Hyperlipidemia This is a chronic problem. The current episode started more than 1 year ago. The problem is controlled. Recent lipid tests were reviewed and are normal. Exacerbating diseases include diabetes. She has no history of hypothyroidism or obesity. Pertinent negatives include no chest pain, focal sensory loss, focal weakness, leg pain, myalgias or shortness of breath. Current antihyperlipidemic treatment includes statins. There are no compliance problems.  Risk factors for coronary artery disease include dyslipidemia.  Diabetes She presents for her follow-up diabetic visit. Diabetes type: prediabetes. Her disease course has been stable. There are no hypoglycemic associated symptoms. There are no diabetic associated symptoms. Pertinent negatives for diabetes include no chest pain, no fatigue, no polydipsia and no polyuria. There are no hypoglycemic complications. Symptoms are stable. There are no diabetic complications. Risk factors for coronary artery disease include dyslipidemia. Current diabetic treatment includes oral agent (monotherapy) and diet. Meal planning includes avoidance of concentrated sweets and carbohydrate counting. An ACE inhibitor/angiotensin II receptor blocker is not being taken.    Lab Results  Component Value Date   NA 137 01/25/2022   K 3.9 01/25/2022   CO2 27 01/25/2022   GLUCOSE 104 (H) 01/25/2022   BUN 19 01/25/2022   CREATININE 0.66 01/25/2022   CALCIUM 9.5 01/25/2022   EGFR 79 07/19/2019   GFRNONAA >60 01/25/2022   Lab Results  Component Value Date   CHOL 134 07/19/2019   HDL 55 07/19/2019   LDLCALC 64 07/19/2019   TRIG 75 07/19/2019   Lab Results  Component Value Date   TSH 3.57 07/19/2019   Lab Results  Component Value Date   HGBA1C 6.0 (H) 01/25/2022   Lab Results  Component Value Date   WBC 7.3 01/25/2022   HGB 13.6  01/25/2022   HCT 40.5 01/25/2022   MCV 86.2 01/25/2022   PLT 262 01/25/2022   Lab Results  Component Value Date   ALT 24 01/25/2022   AST 23 01/25/2022   ALKPHOS 52 01/25/2022   BILITOT 0.7 01/25/2022   No results found for: "25OHVITD2", "25OHVITD3", "VD25OH"   Review of Systems  Constitutional:  Negative for fatigue and unexpected weight change.  HENT:  Negative for trouble swallowing.   Eyes:  Negative for visual disturbance.  Respiratory:  Negative for chest tightness and shortness of breath.   Cardiovascular:  Negative for chest pain and palpitations.  Gastrointestinal:  Negative for abdominal pain.  Endocrine: Negative for polydipsia and polyuria.  Genitourinary:  Negative for difficulty urinating and vaginal bleeding.  Musculoskeletal:  Negative for myalgias.  Neurological:  Negative for focal weakness.  Hematological:  Negative for adenopathy. Does not bruise/bleed easily.    There are no problems to display for this patient.   Allergies  Allergen Reactions   Penicillins Swelling    Past Surgical History:  Procedure Laterality Date   LAPAROSCOPY  1985    Social History   Tobacco Use   Smoking status: Former    Types: Cigarettes    Quit date: 08/08/2018    Years since quitting: 3.9   Smokeless tobacco: Never  Vaping Use   Vaping Use: Never used  Substance Use Topics   Alcohol use: Yes    Comment: rarely   Drug use: No     Medication list has been reviewed and updated.  Current Meds  Medication Sig   atorvastatin (LIPITOR)  10 MG tablet Take 10 mg by mouth daily.   cetirizine (ZYRTEC) 10 MG tablet Take 10 mg by mouth daily.   fluticasone (FLONASE) 50 MCG/ACT nasal spray Place into the nose as needed.   metFORMIN (GLUCOPHAGE) 500 MG tablet Take 1 tablet (500 mg total) by mouth daily with breakfast.   vitamin B-12 (CYANOCOBALAMIN) 1000 MCG tablet Take 1,000 mcg by mouth daily.   [DISCONTINUED] albuterol (VENTOLIN HFA) 108 (90 Base) MCG/ACT inhaler  Inhale into the lungs.   [DISCONTINUED] brompheniramine-pseudoephedrine-DM 30-2-10 MG/5ML syrup Take by mouth.       07/05/2022    8:58 AM 05/20/2022    1:29 PM  GAD 7 : Generalized Anxiety Score  Nervous, Anxious, on Edge 0 0  Control/stop worrying 0 0  Worry too much - different things 0 0  Trouble relaxing 0 0  Restless 0 0  Easily annoyed or irritable 0 0  Afraid - awful might happen 0 0  Total GAD 7 Score 0 0  Anxiety Difficulty Not difficult at all Not difficult at all       07/05/2022    8:57 AM 05/20/2022    1:29 PM  Depression screen PHQ 2/9  Decreased Interest 0 0  Down, Depressed, Hopeless 0 0  PHQ - 2 Score 0 0  Altered sleeping 0 0  Tired, decreased energy 0 0  Change in appetite 0 0  Feeling bad or failure about yourself  0 0  Trouble concentrating 0 0  Moving slowly or fidgety/restless 0 0  Suicidal thoughts 0 0  PHQ-9 Score 0 0  Difficult doing work/chores Not difficult at all Not difficult at all    BP Readings from Last 3 Encounters:  07/05/22 128/64  05/20/22 138/80  01/25/22 139/69    Physical Exam Vitals and nursing note reviewed. Exam conducted with a chaperone present.  Constitutional:      General: She is not in acute distress.    Appearance: She is not diaphoretic.  HENT:     Head: Normocephalic and atraumatic.     Right Ear: External ear normal.     Left Ear: External ear normal.     Nose: Nose normal.  Eyes:     General:        Right eye: No discharge.        Left eye: No discharge.     Conjunctiva/sclera: Conjunctivae normal.     Pupils: Pupils are equal, round, and reactive to light.  Neck:     Thyroid: No thyromegaly.     Vascular: No JVD.  Cardiovascular:     Rate and Rhythm: Normal rate and regular rhythm.     Heart sounds: Normal heart sounds. No murmur heard.    No friction rub. No gallop.  Pulmonary:     Effort: Pulmonary effort is normal.     Breath sounds: Normal breath sounds.  Abdominal:     General: Bowel  sounds are normal.     Palpations: Abdomen is soft. There is no hepatomegaly, splenomegaly or mass.     Tenderness: There is no abdominal tenderness. There is no guarding or rebound.  Musculoskeletal:        General: Normal range of motion.     Cervical back: Normal range of motion and neck supple.  Lymphadenopathy:     Cervical: No cervical adenopathy.  Skin:    General: Skin is warm and dry.  Neurological:     Mental Status: She is alert.  Deep Tendon Reflexes: Reflexes are normal and symmetric.     Wt Readings from Last 3 Encounters:  07/05/22 146 lb (66.2 kg)  05/20/22 148 lb (67.1 kg)  01/25/22 147 lb (66.7 kg)    BP 128/64   Pulse 63   Ht _0  (1.626 m)   Wt 146 lb (66.2 kg)   SpO2 96%   BMI 25.06 kg/m   Assessment and Plan:  1. Prediabetes Chronic.  Controlled.  Stable.  Last A1c was 6.4.  Patient will continue metformin 500 mg once a day.  We will check microalbuminuria as well as A1c at to see for current level of control. - Microalbumin / creatinine urine ratio - metFORMIN (GLUCOPHAGE) 500 MG tablet; Take 1 tablet (500 mg total) by mouth daily with breakfast.  Dispense: 90 tablet; Refill: 1 - Hemoglobin A1c  2. Familial hypercholesterolemia Chronic.  Controlled.  Stable.  Continue atorvastatin 10 mg once a day.  Will check lipid panel for current level of LDL L controlled we will recheck patient in 6 months. - Lipid Panel With LDL/HDL Ratio - atorvastatin (LIPITOR) 10 MG tablet; Take 1 tablet (10 mg total) by mouth daily.  Dispense: 90 tablet; Refill: 1   Discussed with patient need for cervical Pap smear.  She said her last one was in [place] in [place].  We will send a release of information to see if this was Pap with HPV.  If not we will revisit this in [place] and discussed need for Pap smear. Otilio Miu, MD

## 2022-07-06 LAB — LIPID PANEL WITH LDL/HDL RATIO
Cholesterol, Total: 189 mg/dL (ref 100–199)
HDL: 72 mg/dL (ref >39)
LDL Chol Calc (NIH): 103 mg/dL — ABNORMAL HIGH (ref 0–99)
LDL/HDL Ratio: 1.4 ratio (ref 0.0–3.2)
Triglycerides: 74 mg/dL (ref 0–149)
VLDL Cholesterol Cal: 14 mg/dL (ref 5–40)

## 2022-07-06 LAB — HEMOGLOBIN A1C
Est. average glucose Bld gHb Est-mCnc: 123 mg/dL
Hgb A1c MFr Bld: 5.9 % — ABNORMAL HIGH (ref 4.8–5.6)

## 2022-07-06 LAB — MICROALBUMIN / CREATININE URINE RATIO
Creatinine, Urine: 20 mg/dL
Microalb/Creat Ratio: <15 mg/g creat (ref 0–29)
Microalbumin, Urine: <3 ug/mL

## 2022-07-21 ENCOUNTER — Encounter: Payer: Self-pay | Admitting: Obstetrics

## 2022-07-21 ENCOUNTER — Other Ambulatory Visit (HOSPITAL_COMMUNITY)
Admission: RE | Admit: 2022-07-21 | Discharge: 2022-07-21 | Disposition: A | Discharge status: Home / Self Care | Payer: 59 | Source: Ambulatory Visit | Attending: Obstetrics | Admitting: Obstetrics

## 2022-07-21 ENCOUNTER — Ambulatory Visit: Payer: 59 | Admitting: Obstetrics

## 2022-07-21 VITALS — BP 132/52 | HR 67 | Ht 64.0 in | Wt 145.0 lb

## 2022-07-21 DIAGNOSIS — Z124 Encounter for screening for malignant neoplasm of cervix: Secondary | ICD-10-CM

## 2022-07-21 DIAGNOSIS — Z1231 Encounter for screening mammogram for malignant neoplasm of breast: Secondary | ICD-10-CM

## 2022-07-21 LAB — HM PAP SMEAR: HM Pap smear: NORMAL

## 2022-07-21 NOTE — Progress Notes (Signed)
Obstetrics & Gynecology Office Visit   Chief Complaint:  Chief Complaint  Patient presents with   Referral    Pap smear only    History of Present Illness: Cassidy Wood presents as a new patient , referred y her Internist for a pap smear. Her last pap was a number of years ago; performed in [place], and she has not been able to get the records. Cassidy Wood is married with two grown children; hx of 2 SVDs. No Hx of abnormal paps. She has had a mammogram , but it was several years ago. She denies a hx of breast cancer in her family. She is type 2 diabetic, and is followed by Dr. Yetta Barre, her PCP. She admits to some dyspareunia, largely due to vaginal dryness.   Review of Systems:  Review of Systems  Constitutional: Negative.   HENT: Negative.    Eyes: Negative.   Cardiovascular: Negative.   Gastrointestinal: Negative.   Genitourinary: Negative.   Skin: Negative.   All other systems reviewed and are negative.    Past Medical History:  Past Medical History:  Diagnosis Date   Allergy    Diabetes mellitus without complication (HCC)    Hypercholesterolemia     Past Surgical History:  Past Surgical History:  Procedure Laterality Date   LAPAROSCOPY  1985    Gynecologic History: No LMP recorded. Patient is postmenopausal.  Obstetric History: G2P2002  Family History:  Family History  Problem Relation Age of Onset   Diabetes Mother    Diabetes Father    COPD Father    Kidney cancer Father 31   Heart disease Maternal Grandfather    Heart disease Paternal Grandfather     Social History:  Social History   Socioeconomic History   Marital status: Married    Spouse name: Not on file   Number of children: Not on file   Years of education: Not on file   Highest education level: Not on file  Occupational History   Not on file  Tobacco Use   Smoking status: Former    Types: Cigarettes    Quit date: 08/08/2018    Years since quitting: 3.9   Smokeless tobacco: Never  Vaping Use    Vaping Use: Never used  Substance and Sexual Activity   Alcohol use: Yes    Comment: rarely   Drug use: No   Sexual activity: Yes    Birth control/protection: Post-menopausal  Other Topics Concern   Not on file  Social History Narrative   Not on file   Social Determinants of Health   Financial Resource Strain: Not on file  Food Insecurity: Not on file  Transportation Needs: Not on file  Physical Activity: Not on file  Stress: Not on file  Social Connections: Not on file  Intimate Partner Violence: Not on file    Allergies:  Allergies  Allergen Reactions   Penicillins Swelling    Medications: Prior to Admission medications   Medication Sig Start Date End Date Taking? Authorizing Provider  atorvastatin (LIPITOR) 10 MG tablet Take 1 tablet (10 mg total) by mouth daily. 07/05/22  Yes Duanne Limerick, MD  cetirizine (ZYRTEC) 10 MG tablet Take 10 mg by mouth daily.   Yes [provider]  fluticasone (FLONASE) 50 MCG/ACT nasal spray Place into the nose as needed. 08/01/17  Yes [provider]  metFORMIN (GLUCOPHAGE) 500 MG tablet Take 1 tablet (500 mg total) by mouth daily with breakfast. 07/05/22 01/01/23 Yes Duanne Limerick, MD  vitamin B-12 (CYANOCOBALAMIN) 1000 MCG tablet Take 1,000 mcg by mouth daily.   Yes [provider]    Physical Exam Vitals:  Vitals:   59/16/23 1043  BP: (!) 132/52  Pulse: 67   No LMP recorded. Patient is postmenopausal.  Physical Exam Constitutional:      Appearance: Normal appearance. She is normal weight.  Cardiovascular:     Rate and Rhythm: Normal rate and regular rhythm.     Pulses: Normal pulses.     Heart sounds: Normal heart sounds.  Pulmonary:     Effort: Pulmonary effort is normal.     Breath sounds: Normal breath sounds.  Abdominal:     Palpations: Abdomen is soft.  Genitourinary:    General: Normal vulva.     Rectum: Normal.     Comments: Uterus is anteverted, non enlarged. No CMT No visible  vaginal discharge. Cervix appears normal. Musculoskeletal:        General: Normal range of motion.     Cervical back: Normal range of motion and neck supple.  Skin:    General: Skin is warm and dry.  Neurological:     General: No focal deficit present.     Mental Status: She is alert and oriented to person, place, and time.  Psychiatric:        Mood and Affect: Mood normal.        Behavior: Behavior normal.      Assessment: 61 y.o. [id number] - new patient for pap smear. Overdue for mammogram.  Plan: Problem List Items Addressed This Visit   None Visit Diagnoses     Cervical cancer screening    -  Primary   Relevant Orders   Cytology - PAP     Mammogram ordered to be done in [place]. Pap sent. She can have her next pap in 5 years. Encouraged her to RTC for a Well Woman Exam.  Mirna Mires, CNM  07/21/2022 11:36 AM

## 2022-07-25 LAB — CYTOLOGY - PAP
Comment: NEGATIVE
Diagnosis: NEGATIVE
High risk HPV: POSITIVE — AB

## 2022-07-28 ENCOUNTER — Encounter: Payer: Self-pay | Admitting: Obstetrics

## 2022-07-28 DIAGNOSIS — R87610 Atypical squamous cells of undetermined significance on cytologic smear of cervix (ASC-US): Secondary | ICD-10-CM

## 2022-07-28 HISTORY — DX: Atypical squamous cells of undetermined significance on cytologic smear of cervix (ASC-US): R87.610

## 2022-12-26 ENCOUNTER — Telehealth: Payer: Self-pay | Admitting: Family Medicine

## 2022-12-26 NOTE — Telephone Encounter (Signed)
Copied from CRM [id number]. Topic: Complaint - Billing/Coding >> Dec 26, 2022  1:41 PM Ja-Kwan M wrote: Date of Incident: 07/05/22 Details of complaint: Patient stated that Dr. Yetta Barre referred her to OBGYN for pap smear however she was told by her insurance that the claim is denied because she has exceeded the OBGYN services due to having this done with Dr. Yetta Barre on 07/05/22. Patient reports that she did not have this done with Dr. Yetta Barre How would the patient like to see it resolved? Patient would like this corrected so her insurance will cover her OBGYN visit done on 07/21/22 with Jackelyn Poling. Katina Degree  On a scale of 1-10, how was your experience?  What would it take to bring it to a 10?   Pt stated she has already reached out to [facility] Billing so she requests to be contacted to resolve the matter. Cb# [phone]  Route to Research officer, political party.

## 2023-01-03 ENCOUNTER — Encounter: Payer: Self-pay | Admitting: Family Medicine

## 2023-01-03 ENCOUNTER — Ambulatory Visit: Payer: 59 | Admitting: Family Medicine

## 2023-01-03 ENCOUNTER — Ambulatory Visit (INDEPENDENT_AMBULATORY_CARE_PROVIDER_SITE_OTHER): Payer: 59 | Admitting: Family Medicine

## 2023-01-03 VITALS — BP 126/70 | HR 78 | Ht 64.0 in | Wt 151.0 lb

## 2023-01-03 DIAGNOSIS — R7303 Prediabetes: Secondary | ICD-10-CM | POA: Diagnosis not present

## 2023-01-03 DIAGNOSIS — E7801 Familial hypercholesterolemia: Secondary | ICD-10-CM | POA: Diagnosis not present

## 2023-01-03 MED ORDER — METFORMIN HCL 500 MG PO TABS: 500.0000 mg | ORAL_TABLET | Freq: Every day | ORAL | 1 refills | Status: DC

## 2023-01-03 MED ORDER — ATORVASTATIN CALCIUM 10 MG PO TABS: 10.0000 mg | ORAL_TABLET | Freq: Every day | ORAL | 1 refills | Status: DC

## 2023-01-03 NOTE — Progress Notes (Unsigned)
Date:  01/03/2023   Name:  Cassidy Wood   DOB:  [birth year]   MRN:  [id number]   Chief Complaint: Hyperlipidemia and Diabetes  Hyperlipidemia This is a chronic problem. The current episode started more than 1 year ago. The problem is controlled. Exacerbating diseases include diabetes. Pertinent negatives include no chest pain or shortness of breath. Current antihyperlipidemic treatment includes statins. The current treatment provides moderate improvement of lipids. There are no compliance problems.  Risk factors for coronary artery disease include hypertension and dyslipidemia.  Diabetes She presents for her follow-up diabetic visit. She has type 2 diabetes mellitus. Her disease course has been stable. There are no hypoglycemic associated symptoms. Pertinent negatives for diabetes include no blurred vision, no chest pain, no fatigue, no polydipsia, no polyuria, no visual change and no weight loss. There are no hypoglycemic complications. Symptoms are stable. There are no diabetic complications. There are no known risk factors for coronary artery disease. Current diabetic treatment includes oral agent (monotherapy). She is following a generally healthy diet.    Lab Results  Component Value Date   NA 137 01/25/2022   K 3.9 01/25/2022   CO2 27 01/25/2022   GLUCOSE 104 (H) 01/25/2022   BUN 19 01/25/2022   CREATININE 0.66 01/25/2022   CALCIUM 9.5 01/25/2022   EGFR 79 07/19/2019   GFRNONAA >60 01/25/2022   Lab Results  Component Value Date   CHOL 189 07/05/2022   HDL 72 07/05/2022   LDLCALC 103 (H) 07/05/2022   TRIG 74 07/05/2022   Lab Results  Component Value Date   TSH 3.57 07/19/2019   Lab Results  Component Value Date   HGBA1C 5.9 (H) 07/05/2022   Lab Results  Component Value Date   WBC 7.3 01/25/2022   HGB 13.6 01/25/2022   HCT 40.5 01/25/2022   MCV 86.2 01/25/2022   PLT 262 01/25/2022   Lab Results  Component Value Date   ALT 24 01/25/2022   AST 23 01/25/2022    ALKPHOS 52 01/25/2022   BILITOT 0.7 01/25/2022   No results found for: "25OHVITD2", "25OHVITD3", "VD25OH"   Review of Systems  Constitutional:  Negative for fatigue and weight loss.  Eyes:  Negative for blurred vision.  Respiratory:  Negative for shortness of breath.   Cardiovascular:  Negative for chest pain.  Endocrine: Negative for polydipsia and polyuria.    Patient Active Problem List   Diagnosis Date Noted   ASCUS with positive high risk HPV cervical 07/28/2022    Allergies  Allergen Reactions   Penicillins Swelling    Past Surgical History:  Procedure Laterality Date   LAPAROSCOPY  1985    Social History   Tobacco Use   Smoking status: Former    Types: Cigarettes    Quit date: 08/08/2018    Years since quitting: 4.4   Smokeless tobacco: Never  Vaping Use   Vaping Use: Never used  Substance Use Topics   Alcohol use: Yes    Comment: rarely   Drug use: No     Medication list has been reviewed and updated.  Current Meds  Medication Sig   atorvastatin (LIPITOR) 10 MG tablet Take 1 tablet (10 mg total) by mouth daily.   cetirizine (ZYRTEC) 10 MG tablet Take 10 mg by mouth daily.   fluticasone (FLONASE) 50 MCG/ACT nasal spray Place into the nose as needed.   metFORMIN (GLUCOPHAGE) 500 MG tablet Take 1 tablet (500 mg total) by mouth daily with breakfast.   vitamin B-12 (  CYANOCOBALAMIN) 1000 MCG tablet Take 1,000 mcg by mouth daily.       01/03/2023    4:05 PM 07/21/2022   10:52 AM 07/05/2022    8:58 AM 05/20/2022    1:29 PM  GAD 7 : Generalized Anxiety Score  Nervous, Anxious, on Edge 0 0 0 0  Control/stop worrying 0 0 0 0  Worry too much - different things 0 0 0 0  Trouble relaxing 0 0 0 0  Restless 0 0 0 0  Easily annoyed or irritable 0 0 0 0  Afraid - awful might happen 0 0 0 0  Total GAD 7 Score 0 0 0 0  Anxiety Difficulty Not difficult at all  Not difficult at all Not difficult at all       01/03/2023    4:05 PM 07/21/2022   10:52 AM  07/05/2022    8:57 AM  Depression screen PHQ 2/9  Decreased Interest 0 0 0  Down, Depressed, Hopeless 0 0 0  PHQ - 2 Score 0 0 0  Altered sleeping 0 0 0  Tired, decreased energy 0 0 0  Change in appetite 0 0 0  Feeling bad or failure about yourself  0 0 0  Trouble concentrating 0 0 0  Moving slowly or fidgety/restless 0 0 0  Suicidal thoughts 0 0 0  PHQ-9 Score 0 0 0  Difficult doing work/chores Not difficult at all  Not difficult at all    BP Readings from Last 3 Encounters:  01/03/23 126/70  07/21/22 (!) 132/52  07/05/22 128/64    Physical Exam Vitals and nursing note reviewed.  Constitutional:      General: She is not in acute distress.    Appearance: She is not diaphoretic.  HENT:     Head: Normocephalic and atraumatic.     Right Ear: External ear normal.     Left Ear: External ear normal.     Nose: Nose normal.  Eyes:     General:        Right eye: No discharge.        Left eye: No discharge.     Conjunctiva/sclera: Conjunctivae normal.     Pupils: Pupils are equal, round, and reactive to light.  Neck:     Thyroid: No thyromegaly.     Vascular: No JVD.  Cardiovascular:     Rate and Rhythm: Normal rate and regular rhythm.     Heart sounds: Normal heart sounds. No murmur heard.    No friction rub. No gallop.  Pulmonary:     Effort: Pulmonary effort is normal.     Breath sounds: Normal breath sounds.  Abdominal:     General: Bowel sounds are normal.     Palpations: Abdomen is soft.  Musculoskeletal:     Cervical back: Neck supple.  Lymphadenopathy:     Cervical: No cervical adenopathy.  Skin:    General: Skin is warm.  Neurological:     Mental Status: She is alert.     Deep Tendon Reflexes: Reflexes are normal and symmetric.     Wt Readings from Last 3 Encounters:  01/03/23 151 lb (68.5 kg)  07/21/22 145 lb (65.8 kg)  07/05/22 146 lb (66.2 kg)    BP 126/70   Pulse 78   Ht 5\' 4"  (1.626 m)   Wt 151 lb (68.5 kg)   SpO2 98%   BMI 25.92 kg/m    Assessment and Plan:  1. Prediabetes Chronic.  Controlled.  Stable.  Blood pressure today is 126/70.  Asymptomatic.  Tolerating medication well.  Continue metformin 500 mg daily and will check A1c for current level of control whether we need to increase to twice a day as well as CMP for electrolytes and GFR. - metFORMIN (GLUCOPHAGE) 500 MG tablet; Take 1 tablet (500 mg total) by mouth daily with breakfast.  Dispense: 90 tablet; Refill: 1 - HgB A1c - Comprehensive Metabolic Panel (CMET)  2. Familial hypercholesterolemia Chronic.  Controlled.  Stable.  Currently is on atorvastatin 10 mg once a day and will check lipid panel prior current level of control and patient will wait to have labs drawn later in week and that she is currently not fasting. - atorvastatin (LIPITOR) 10 MG tablet; Take 1 tablet (10 mg total) by mouth daily.  Dispense: 90 tablet; Refill: 1 - Lipid Panel With LDL/HDL Ratio    Elizabeth Sauer, MD

## 2023-01-04 ENCOUNTER — Encounter: Payer: Self-pay | Admitting: Family Medicine

## 2023-01-04 DIAGNOSIS — R7303 Prediabetes: Secondary | ICD-10-CM | POA: Diagnosis not present

## 2023-01-04 DIAGNOSIS — E7801 Familial hypercholesterolemia: Secondary | ICD-10-CM | POA: Diagnosis not present

## 2023-01-05 ENCOUNTER — Ambulatory Visit: Payer: Self-pay | Admitting: *Deleted

## 2023-01-05 LAB — COMPREHENSIVE METABOLIC PANEL
ALT: 30 IU/L (ref 0–32)
AST: 29 IU/L (ref 0–40)
Albumin/Globulin Ratio: 2 (ref 1.2–2.2)
Albumin: 4.6 g/dL (ref 3.8–4.9)
Alkaline Phosphatase: 64 IU/L (ref 44–121)
BUN/Creatinine Ratio: 20 (ref 12–28)
BUN: 16 mg/dL (ref 8–27)
Bilirubin Total: 0.6 mg/dL (ref 0.0–1.2)
CO2: 24 mmol/L (ref 20–29)
Calcium: 9.8 mg/dL (ref 8.7–10.3)
Chloride: 105 mmol/L (ref 96–106)
Creatinine, Ser: 0.8 mg/dL (ref 0.57–1.00)
Globulin, Total: 2.3 g/dL (ref 1.5–4.5)
Glucose: 119 mg/dL — ABNORMAL HIGH (ref 70–99)
Potassium: 4.5 mmol/L (ref 3.5–5.2)
Sodium: 145 mmol/L — ABNORMAL HIGH (ref 134–144)
Total Protein: 6.9 g/dL (ref 6.0–8.5)
eGFR: 84 mL/min/{1.73_m2} (ref >59)

## 2023-01-05 LAB — LIPID PANEL WITH LDL/HDL RATIO
Cholesterol, Total: 217 mg/dL — ABNORMAL HIGH (ref 100–199)
HDL: 71 mg/dL (ref >39)
LDL Chol Calc (NIH): 130 mg/dL — ABNORMAL HIGH (ref 0–99)
LDL/HDL Ratio: 1.8 ratio (ref 0.0–3.2)
Triglycerides: 93 mg/dL (ref 0–149)
VLDL Cholesterol Cal: 16 mg/dL (ref 5–40)

## 2023-01-05 LAB — HEMOGLOBIN A1C
Est. average glucose Bld gHb Est-mCnc: 131 mg/dL
Hgb A1c MFr Bld: 6.2 % — ABNORMAL HIGH (ref 4.8–5.6)

## 2023-01-05 NOTE — Telephone Encounter (Signed)
Attempted to return her call.   Left a voicemail to call back to discuss symptoms with a nurse. 

## 2023-01-05 NOTE — Telephone Encounter (Signed)
  Chief Complaint: medication problem Symptoms: NA Frequency: today Pertinent Negatives: NA Disposition: [] ED /[] Urgent Care (no appt availability in office) / [] Appointment(In office/virtual)/ []  [facility]/ [] Home Care/ [] Refused Recommended Disposition /[]  Mobile Bus/ [x]  Follow-up with PCP Additional Notes: pt went to pharmacy and picked up new dose of Atorvastatin that she got advised of this morning. Pt states 10mg  was sent in again with new prescription number so she spoke with pharmacist and they advised her to take double to equal 20mg . Pt was given 10mg  #30/5 refills so says she can refill if needed to last to 30 days in case insurance wont pay for 20mg  dose. Pt would like 20mg  sent to pharmacy.   Reason for Disposition  [1] Prescription not at pharmacy AND [2] was prescribed by PCP recently (Exception: [facility] has access to EMR and prescription is recorded there. Go to Home Care and confirm for pharmacy.)  Answer Assessment - Initial Assessment Questions 1. NAME of MEDICINE: "What medicine(s) are you calling about?"     Atorvastatin  2. QUESTION: "What is your question?" (e.g., double dose of medicine, side effect)     Received lab note for increase dose to 20mg  but 10mg  was only sent to pharmacy and that was previous dose 3. PRESCRIBER: "Who prescribed the medicine?" Reason: if prescribed by specialist, call should be referred to that group.     Dr. Yetta Barre  4. SYMPTOMS: "Do you have any symptoms?" If Yes, ask: "What symptoms are you having?"  "How bad are the symptoms (e.g., mild, moderate, severe)     NA  Protocols used: Medication Question Call-A-AH

## 2023-01-05 NOTE — Telephone Encounter (Signed)
Message from Allen Kell sent at 01/05/2023  3:16 PM EDT  Summary: pt needing clarification on medication   Pt states that she was told that her medication was changing from 10mg  to 20mg . Pt went to pick up her medication from the pharmacy and was given 10mg  (atorvastatin (LIPITOR) 10 MG tablet). Pt is wanting to know if she is suppose to double up on the 10mg  or if the correct prescripiton of 20mg  need to be sent to the pharmacy. Please call pt back to discuss.          Call History   Type Contact Phone/Fax User  01/05/2023 03:15 PM EDT Phone (Incoming) Hermine Messick, Cassidy Wood (Self) [phone] (H) Allred, Dondra Prader

## 2023-01-06 ENCOUNTER — Other Ambulatory Visit: Payer: Self-pay

## 2023-01-06 DIAGNOSIS — E7801 Familial hypercholesterolemia: Secondary | ICD-10-CM

## 2023-01-06 MED ORDER — ATORVASTATIN CALCIUM 20 MG PO TABS: 20.0000 mg | ORAL_TABLET | Freq: Every day | ORAL | 1 refills | Status: DC

## 2023-01-23 ENCOUNTER — Other Ambulatory Visit: Payer: Self-pay | Admitting: Family Medicine

## 2023-01-23 DIAGNOSIS — E7801 Familial hypercholesterolemia: Secondary | ICD-10-CM

## 2023-01-23 NOTE — Telephone Encounter (Signed)
Medication Refill - Medication: atorvastatin (LIPITOR) 20 MG tablet  Has the patient contacted their pharmacy? Yes.   Pt told to contact provider  Preferred Pharmacy (with phone number or street name):  [facility] [address], [place] - [address] Phone: [phone]  Fax: [phone]     Has the patient been seen for an appointment in the last year OR does the patient have an upcoming appointment? Yes.    Agent: Please be advised that RX refills may take up to 3 business days. We ask that you follow-up with your pharmacy.

## 2023-01-24 MED ORDER — ATORVASTATIN CALCIUM 20 MG PO TABS: 20.0000 mg | ORAL_TABLET | Freq: Every day | ORAL | 1 refills | Status: DC

## 2023-01-24 NOTE — Telephone Encounter (Signed)
Requested Prescriptions  Pending Prescriptions Disp Refills   atorvastatin (LIPITOR) 20 MG tablet 90 tablet 1    Sig: Take 1 tablet (20 mg total) by mouth daily.     Cardiovascular:  Antilipid - Statins Failed - 01/23/2023 12:35 PM      Failed - Lipid Panel in normal range within the last 12 months    Cholesterol, Total  Date Value Ref Range Status  01/04/2023 217 (H) 100 - 199 mg/dL Final   LDL Chol Calc (NIH)  Date Value Ref Range Status  01/04/2023 130 (H) 0 - 99 mg/dL Final   HDL  Date Value Ref Range Status  01/04/2023 71 >39 mg/dL Final   Triglycerides  Date Value Ref Range Status  01/04/2023 93 0 - 149 mg/dL Final         Passed - Patient is not pregnant      Passed - Valid encounter within last 12 months    Recent Outpatient Visits           3 weeks ago Prediabetes   [facility]   6 months ago Prediabetes   [facility]   8 months ago Prediabetes   [facility]

## 2023-04-30 ENCOUNTER — Ambulatory Visit
Admission: RE | Admit: 2023-04-30 | Discharge: 2023-04-30 | Disposition: A | Discharge status: Home / Self Care | Payer: 59 | Source: Ambulatory Visit | Attending: Internal Medicine | Admitting: Internal Medicine

## 2023-04-30 VITALS — BP 105/78 | HR 75 | Temp 97.7°F | Resp 15 | Ht 64.0 in | Wt 151.0 lb

## 2023-04-30 DIAGNOSIS — R42 Dizziness and giddiness: Secondary | ICD-10-CM

## 2023-04-30 MED ORDER — METHYLPREDNISOLONE ACETATE 40 MG/ML IJ SUSP
40.0000 mg | Freq: Once | INTRAMUSCULAR | Status: AC
  Administered 2023-04-30: 40 mg via INTRAMUSCULAR

## 2023-04-30 MED ORDER — KETOROLAC TROMETHAMINE 30 MG/ML IJ SOLN
30.0000 mg | Freq: Once | INTRAMUSCULAR | Status: AC
  Administered 2023-04-30: 30 mg via INTRAMUSCULAR

## 2023-04-30 MED ORDER — ONDANSETRON HCL 4 MG/2ML IJ SOLN
8.0000 mg | Freq: Once | INTRAMUSCULAR | Status: AC
  Administered 2023-04-30: 8 mg via INTRAMUSCULAR

## 2023-04-30 MED ORDER — ONDANSETRON 8 MG PO TBDP: 8.0000 mg | ORAL_TABLET | Freq: Three times a day (TID) | ORAL | 0 refills | Status: AC | PRN

## 2023-04-30 MED ORDER — MECLIZINE HCL 25 MG PO TABS: 25.0000 mg | ORAL_TABLET | Freq: Three times a day (TID) | ORAL | 0 refills | Status: DC | PRN

## 2023-04-30 NOTE — ED Provider Notes (Signed)
[facility]    CSN: [id number] Arrival date & time: 04/30/23  1056      History   Chief Complaint Chief Complaint  Patient presents with   Dizziness    Appointment    HPI Cassidy Wood is a 62 y.o. female.   62 yr female who presents to urgent care with complaints of severe dizziness with nausea and vomiting. At around 2am woke up when she rolled over and got spinning in the room, had to hold mattress due to symptoms. It went away but then recurred after she rolled over again. Happened 3 times total so she woke up her husband and decided to come to urgent care. When she got up from the bed, she was very careful but after sitting on the couch and getting up she got dizziness with nausea with vomiting. Did have 2nd shingles vaccine Tuesday and crown placed on Monday.    Dizziness Associated symptoms: headaches, nausea and vomiting   Associated symptoms: no chest pain, no palpitations and no shortness of breath     Past Medical History:  Diagnosis Date   Allergy    Diabetes mellitus without complication (HCC)    Hypercholesterolemia     Patient Active Problem List   Diagnosis Date Noted   ASCUS with positive high risk HPV cervical 07/28/2022    Past Surgical History:  Procedure Laterality Date   LAPAROSCOPY  1985    OB History     Gravida  2   Para  2   Term  2   Preterm      AB      Living  2      SAB      IAB      Ectopic      Multiple      Live Births  2            Home Medications    Prior to Admission medications   Medication Sig Start Date End Date Taking? Authorizing Provider  atorvastatin (LIPITOR) 20 MG tablet Take 1 tablet (20 mg total) by mouth daily. 01/24/23  Yes Duanne Limerick, MD  cetirizine (ZYRTEC) 10 MG tablet Take 10 mg by mouth daily.   Yes [provider]  metFORMIN (GLUCOPHAGE) 500 MG tablet Take 1 tablet (500 mg total) by mouth daily with breakfast. 01/03/23 07/02/23 Yes Duanne Limerick, MD   vitamin B-12 (CYANOCOBALAMIN) 1000 MCG tablet Take 1,000 mcg by mouth daily.   Yes [provider]  fluticasone (FLONASE) 50 MCG/ACT nasal spray Place into the nose as needed. 08/01/17   [provider]    Family History Family History  Problem Relation Age of Onset   Diabetes Mother    Diabetes Father    COPD Father    Kidney cancer Father 69   Heart disease Maternal Grandfather    Heart disease Paternal Grandfather     Social History Social History   Tobacco Use   Smoking status: Former    Current packs/day: 0.00    Types: Cigarettes    Quit date: 08/08/2018    Years since quitting: 4.7   Smokeless tobacco: Never  Vaping Use   Vaping status: Never Used  Substance Use Topics   Alcohol use: Yes    Comment: rarely   Drug use: No     Allergies   Penicillins   Review of Systems Review of Systems  Constitutional:  Positive for activity change. Negative for chills and fever.  HENT:  Negative for congestion, ear pain, sinus pressure, sinus pain and sore throat.   Eyes:  Negative for pain and visual disturbance.  Respiratory:  Negative for cough and shortness of breath.   Cardiovascular:  Negative for chest pain and palpitations.  Gastrointestinal:  Positive for nausea and vomiting. Negative for abdominal pain.  Genitourinary:  Negative for dysuria and hematuria.  Musculoskeletal:  Negative for arthralgias and back pain.  Skin:  Negative for color change and rash.  Neurological:  Positive for dizziness and headaches. Negative for seizures and syncope.  All other systems reviewed and are negative.    Physical Exam Triage Vital Signs ED Triage Vitals  Encounter Vitals Group     BP 04/30/23 1110 105/78     Systolic BP Percentile --      Diastolic BP Percentile --      Pulse Rate 04/30/23 1110 75     Resp 04/30/23 1110 15     Temp 04/30/23 1110 97.7 F (36.5 C)     Temp Source 04/30/23 1110 Oral     SpO2 04/30/23 1110 100 %     Weight  04/30/23 1109 151 lb 0.2 oz (68.5 kg)     Height 04/30/23 1109 5\' 4"  (1.626 m)     Head Circumference --      Peak Flow --      Pain Score 04/30/23 1108 4     Pain Loc --      Pain Education --      Exclude from Growth Chart --    No data found.  Updated Vital Signs BP 105/78 (BP Location: Left Arm)   Pulse 75   Temp 97.7 F (36.5 C) (Oral)   Resp 15   Ht 5\' 4"  (1.626 m)   Wt 151 lb 0.2 oz (68.5 kg)   SpO2 100%   BMI 25.92 kg/m   Visual Acuity Right Eye Distance:   Left Eye Distance:   Bilateral Distance:    Right Eye Near:   Left Eye Near:    Bilateral Near:     Physical Exam Vitals and nursing note reviewed.  Constitutional:      General: She is not in acute distress.    Appearance: She is well-developed.  HENT:     Head: Normocephalic and atraumatic.     Right Ear: Tympanic membrane normal.     Left Ear: Tympanic membrane normal.     Nose: No congestion.     Mouth/Throat:     Mouth: Mucous membranes are moist.  Eyes:     Extraocular Movements: Extraocular movements intact.     Conjunctiva/sclera: Conjunctivae normal.     Pupils: Pupils are equal, round, and reactive to light.  Cardiovascular:     Rate and Rhythm: Normal rate and regular rhythm.     Heart sounds: No murmur heard. Pulmonary:     Effort: Pulmonary effort is normal. No respiratory distress.     Breath sounds: Normal breath sounds.  Abdominal:     Palpations: Abdomen is soft.     Tenderness: There is no abdominal tenderness.  Musculoskeletal:        General: No swelling.     Cervical back: Neck supple.  Skin:    General: Skin is warm and dry.     Capillary Refill: Capillary refill takes less than 2 seconds.  Neurological:     Mental Status: She is alert.     Motor: Motor function is intact.     Coordination: Coordination is  intact.     Comments: Significant dizziness and nausea with eye movement side to side.   Psychiatric:        Mood and Affect: Mood normal.     UC Treatments /  Results  Labs (all labs ordered are listed, but only abnormal results are displayed) Labs Reviewed - No data to display  EKG   Radiology No results found.  Procedures Procedures (including critical care time)  Medications Ordered in UC Medications - No data to display  Initial Impression / Assessment and Plan / UC Course  I have reviewed the triage vital signs and the nursing notes.  Pertinent labs & imaging results that were available during my care of the patient were reviewed by me and considered in my medical decision making (see chart for details).     Vertigo with severe headache: Patient has had a single episode of vertigo with some improvement here with Zofran, Toradol and Medrol.  The patient feels better now and would like to go home.  We will send her home with Antivert and Zofran.  Strict return precautions should this persist.  Patient should avoid driving until the dizziness completely resolves. Final Clinical Impressions(s) / UC Diagnoses   Final diagnoses:  None   Discharge Instructions   None    ED Prescriptions   None    PDMP not reviewed this encounter.   Landis Martins, [place] 04/30/23 1351

## 2023-04-30 NOTE — ED Triage Notes (Signed)
Patient states that when she rolled from her side to back, she states that it felt like the room was spinning.  Patient reports some nausea and HA.  Patient states that the dizziness worsens when she moves or turns her head.

## 2023-04-30 NOTE — Discharge Instructions (Signed)
Use zofran dissolving tablets as needed for nausea. Antivert 3 times daily as needed for vertigo. Return to urgent care if symptoms worsen or fail to improve.

## 2023-05-04 ENCOUNTER — Encounter: Payer: Self-pay | Admitting: Family Medicine

## 2023-05-04 ENCOUNTER — Ambulatory Visit (INDEPENDENT_AMBULATORY_CARE_PROVIDER_SITE_OTHER): Payer: 59 | Admitting: Family Medicine

## 2023-05-04 VITALS — BP 118/72 | HR 82 | Ht 64.0 in | Wt 148.0 lb

## 2023-05-04 DIAGNOSIS — H8110 Benign paroxysmal vertigo, unspecified ear: Secondary | ICD-10-CM | POA: Diagnosis not present

## 2023-05-04 MED ORDER — MECLIZINE HCL 25 MG PO TABS
25.0000 mg | ORAL_TABLET | Freq: Three times a day (TID) | ORAL | 0 refills | Status: AC | PRN
Start: 2023-05-04 — End: ?

## 2023-05-04 NOTE — Patient Instructions (Signed)
How to Perform the Epley Maneuver The Epley maneuver is an exercise that relieves symptoms of vertigo. Vertigo is the feeling that you or your surroundings are moving when they are not. When you feel vertigo, you may feel like the room is spinning and may have trouble walking. The Epley maneuver is used for a type of vertigo caused by a calcium deposit in a part of the inner ear. The maneuver involves changing head positions to help the deposit move out of the area. You can do this maneuver at home whenever you have symptoms of vertigo. You can repeat it in 24 hours if your vertigo has not gone away. Even though the Epley maneuver may relieve your vertigo for a few weeks, it is possible that your symptoms will return. This maneuver relieves vertigo, but it does not relieve dizziness. What are the risks? If it is done correctly, the Epley maneuver is considered safe. Sometimes it can lead to dizziness or nausea that goes away after a short time. If you develop other symptoms--such as changes in vision, weakness, or numbness--stop doing the maneuver and call your health care provider. Supplies needed: A bed or table. A pillow. How to do the Epley maneuver     Sit on the edge of a bed or table with your back straight and your legs extended or hanging over the edge of the bed or table. Turn your head halfway toward the affected ear or side as told by your health care provider. Lie backward quickly with your head turned until you are lying flat on your back. Your head should dangle (head-hanging position). You may want to position a pillow under your shoulders. Hold this position for at least 30 seconds. If you feel dizzy or have symptoms of vertigo, continue to hold the position until the symptoms stop. Turn your head to the opposite direction until your unaffected ear is facing down. Your head should continue to dangle. Hold this position for at least 30 seconds. If you feel dizzy or have symptoms of  vertigo, continue to hold the position until the symptoms stop. Turn your whole body to the same side as your head so that you are positioned on your side. Your head will now be nearly facedown and no longer needs to dangle. Hold for at least 30 seconds. If you feel dizzy or have symptoms of vertigo, continue to hold the position until the symptoms stop. Sit back up. You can repeat the maneuver in 24 hours if your vertigo does not go away. Follow these instructions at home: For 24 hours after doing the Epley maneuver: Keep your head in an upright position. When lying down to sleep or rest, keep your head raised (elevated) with two or more pillows. Avoid excessive neck movements. Activity Do not drive or use machinery if you feel dizzy. After doing the Epley maneuver, return to your normal activities as told by your health care provider. Ask your health care provider what activities are safe for you. General instructions Drink enough fluid to keep your urine pale yellow. Do not drink alcohol. Take over-the-counter and prescription medicines only as told by your health care provider. Keep all follow-up visits. This is important. Preventing vertigo symptoms Ask your health care provider if there is anything you should do at home to prevent vertigo. He or she may recommend that you: Keep your head elevated with two or more pillows while you sleep. Do not sleep on the side of your affected ear. Get  up slowly from bed. Avoid sudden movements during the day. Avoid extreme head positions or movement, such as looking up or bending over. Contact a health care provider if: Your vertigo gets worse. You have other symptoms, including: Nausea. Vomiting. Headache. Get help right away if you: Have vision changes. Have a headache or neck pain that is severe or getting worse. Cannot stop vomiting. Have new numbness or weakness in any part of your body. These symptoms may represent a serious problem  that is an emergency. Do not wait to see if the symptoms will go away. Get medical help right away. Call your local emergency services (911 in the [place] Do not drive yourself to the hospital. Summary Vertigo is the feeling that you or your surroundings are moving when they are not. The Epley maneuver is an exercise that relieves symptoms of vertigo. If the Epley maneuver is done correctly, it is considered safe. This information is not intended to replace advice given to you by your health care provider. Make sure you discuss any questions you have with your health care provider. Document Revised: 07/22/2020 Document Reviewed: 07/22/2020 Elsevier Patient Education  2024 [employer] Benign Positional Vertigo Vertigo is the feeling that you or your surroundings are moving when they are not. Benign positional vertigo is the most common form of vertigo. This is usually a harmless condition (benign). This condition is positional. This means that symptoms are triggered by certain movements and positions. This condition can be dangerous if it occurs while you are doing something that could cause harm to yourself or others. This includes activities such as driving or operating machinery. What are the causes? The inner ear has fluid-filled canals that help your brain sense movement and balance. When the fluid moves, the brain receives messages about your body's position. With benign positional vertigo, calcium crystals in the inner ear break free and disturb the inner ear area. This causes your brain to receive confusing messages about your body's position. What increases the risk? You are more likely to develop this condition if: You are a woman. You are 44 years of age or older. You have recently had a head injury. You have an inner ear disease. What are the signs or symptoms? Symptoms of this condition usually happen when you move your head or your eyes in different directions. Symptoms may start  suddenly and usually last for less than a minute. They include: Loss of balance and falling. Feeling like you are spinning or moving. Feeling like your surroundings are spinning or moving. Nausea and vomiting. Blurred vision. Dizziness. Involuntary eye movement (nystagmus). Symptoms can be mild and cause only minor problems, or they can be severe and interfere with daily life. Episodes of benign positional vertigo may return (recur) over time. Symptoms may also improve over time. How is this diagnosed? This condition may be diagnosed based on: Your medical history. A physical exam of the head, neck, and ears. Positional tests to check for or stimulate vertigo. You may be asked to turn your head and change positions, such as going from sitting to lying down. A health care provider will watch for symptoms of vertigo. You may be referred to a health care provider who specializes in ear, nose, and throat problems (ENT or otolaryngologist) or a provider who specializes in disorders of the nervous system (neurologist). How is this treated?  This condition may be treated in a session in which your health care provider moves your head in specific positions to help  the displaced crystals in your inner ear move. Treatment for this condition may take several sessions. Surgery may be needed in severe cases, but this is rare. In some cases, benign positional vertigo may resolve on its own in 2-4 weeks. Follow these instructions at home: Safety Move slowly. Avoid sudden body or head movements or certain positions, as told by your health care provider. Avoid driving or operating machinery until your health care provider says it is safe. Avoid doing any tasks that would be dangerous to you or others if vertigo occurs. If you have trouble walking or keeping your balance, try using a cane for stability. If you feel dizzy or unstable, sit down right away. Return to your normal activities as told by your health  care provider. Ask your health care provider what activities are safe for you. General instructions Take over-the-counter and prescription medicines only as told by your health care provider. Drink enough fluid to keep your urine pale yellow. Keep all follow-up visits. This is important. Contact a health care provider if: You have a fever. Your condition gets worse or you develop new symptoms. Your family or friends notice any behavioral changes. You have nausea or vomiting that gets worse. You have numbness or a prickling and tingling sensation. Get help right away if you: Have difficulty speaking or moving. Are always dizzy or faint. Develop severe headaches. Have weakness in your legs or arms. Have changes in your hearing or vision. Develop a stiff neck. Develop sensitivity to light. These symptoms may represent a serious problem that is an emergency. Do not wait to see if the symptoms will go away. Get medical help right away. Call your local emergency services (911 in the [place] Do not drive yourself to the hospital. Summary Vertigo is the feeling that you or your surroundings are moving when they are not. Benign positional vertigo is the most common form of vertigo. This condition is caused by calcium crystals in the inner ear that become displaced. This causes a disturbance in an area of the inner ear that helps your brain sense movement and balance. Symptoms include loss of balance and falling, feeling that you or your surroundings are moving, nausea and vomiting, and blurred vision. This condition can be diagnosed based on symptoms, a physical exam, and positional tests. Follow safety instructions as told by your health care provider and keep all follow-up visits. This is important. This information is not intended to replace advice given to you by your health care provider. Make sure you discuss any questions you have with your health care provider. Document Revised: 07/22/2020  Document Reviewed: 07/22/2020 Elsevier Patient Education  2024 [employer].

## 2023-05-04 NOTE — Progress Notes (Signed)
Date:  05/04/2023   Name:  Cassidy Wood   DOB:  16-May-1961   MRN:  [id number]   Chief Complaint: Dizziness  Dizziness This is a new problem. The current episode started in the past 7 days. The problem has been waxing and waning. Associated symptoms include congestion, nausea and vertigo. Pertinent negatives include no chest pain, diaphoresis, fever, headaches, neck pain, numbness or weakness. The symptoms are aggravated by bending and twisting. She has tried acetaminophen (meclizine/zofran) for the symptoms. The treatment provided mild relief.    Lab Results  Component Value Date   NA 145 (H) 01/04/2023   K 4.5 01/04/2023   CO2 24 01/04/2023   GLUCOSE 119 (H) 01/04/2023   BUN 16 01/04/2023   CREATININE 0.80 01/04/2023   CALCIUM 9.8 01/04/2023   EGFR 84 01/04/2023   GFRNONAA >60 01/25/2022   Lab Results  Component Value Date   CHOL 217 (H) 01/04/2023   HDL 71 01/04/2023   LDLCALC 130 (H) 01/04/2023   TRIG 93 01/04/2023   Lab Results  Component Value Date   TSH 3.57 07/19/2019   Lab Results  Component Value Date   HGBA1C 6.2 (H) 01/04/2023   Lab Results  Component Value Date   WBC 7.3 01/25/2022   HGB 13.6 01/25/2022   HCT 40.5 01/25/2022   MCV 86.2 01/25/2022   PLT 262 01/25/2022   Lab Results  Component Value Date   ALT 30 01/04/2023   AST 29 01/04/2023   ALKPHOS 64 01/04/2023   BILITOT 0.6 01/04/2023   No results found for: "25OHVITD2", "25OHVITD3", "VD25OH"   Review of Systems  Constitutional:  Negative for diaphoresis and fever.  HENT:  Positive for congestion. Negative for hearing loss, postnasal drip and rhinorrhea.   Respiratory:  Negative for chest tightness, shortness of breath and wheezing.   Cardiovascular:  Negative for chest pain and palpitations.  Gastrointestinal:  Positive for nausea.  Musculoskeletal:  Negative for neck pain.  Neurological:  Positive for dizziness and vertigo. Negative for weakness, numbness and headaches.    Patient  Active Problem List   Diagnosis Date Noted   ASCUS with positive high risk HPV cervical 07/28/2022    Allergies  Allergen Reactions   Penicillins Swelling    Past Surgical History:  Procedure Laterality Date   LAPAROSCOPY  1985    Social History   Tobacco Use   Smoking status: Former    Current packs/day: 0.00    Types: Cigarettes    Quit date: 08/08/2018    Years since quitting: 4.7   Smokeless tobacco: Never  Vaping Use   Vaping status: Never Used  Substance Use Topics   Alcohol use: Yes    Comment: rarely   Drug use: No     Medication list has been reviewed and updated.  Current Meds  Medication Sig   atorvastatin (LIPITOR) 20 MG tablet Take 1 tablet (20 mg total) by mouth daily.   cetirizine (ZYRTEC) 10 MG tablet Take 10 mg by mouth daily.   fluticasone (FLONASE) 50 MCG/ACT nasal spray Place into the nose as needed.   meclizine (ANTIVERT) 25 MG tablet Take 1 tablet (25 mg total) by mouth 3 (three) times daily as needed for dizziness.   metFORMIN (GLUCOPHAGE) 500 MG tablet Take 1 tablet (500 mg total) by mouth daily with breakfast.   ondansetron (ZOFRAN-ODT) 8 MG disintegrating tablet Take 1 tablet (8 mg total) by mouth every 8 (eight) hours as needed for nausea or vomiting.  vitamin B-12 (CYANOCOBALAMIN) 1000 MCG tablet Take 1,000 mcg by mouth daily.       05/04/2023    4:07 PM 01/03/2023    4:05 PM 07/21/2022   10:52 AM 07/05/2022    8:58 AM  GAD 7 : Generalized Anxiety Score  Nervous, Anxious, on Edge 0 0 0 0  Control/stop worrying 0 0 0 0  Worry too much - different things 0 0 0 0  Trouble relaxing 0 0 0 0  Restless 0 0 0 0  Easily annoyed or irritable 0 0 0 0  Afraid - awful might happen 0 0 0 0  Total GAD 7 Score 0 0 0 0  Anxiety Difficulty Not difficult at all Not difficult at all  Not difficult at all       05/04/2023    4:07 PM 01/03/2023    4:05 PM 07/21/2022   10:52 AM  Depression screen PHQ 2/9  Decreased Interest 0 0 0  Down,  Depressed, Hopeless 0 0 0  PHQ - 2 Score 0 0 0  Altered sleeping 0 0 0  Tired, decreased energy 0 0 0  Change in appetite 0 0 0  Feeling bad or failure about yourself  0 0 0  Trouble concentrating 0 0 0  Moving slowly or fidgety/restless 0 0 0  Suicidal thoughts 0 0 0  PHQ-9 Score 0 0 0  Difficult doing work/chores Not difficult at all Not difficult at all     BP Readings from Last 3 Encounters:  05/04/23 118/72  04/30/23 105/78  01/03/23 126/70    Physical Exam Vitals and nursing note reviewed.  HENT:     Right Ear: Tympanic membrane, ear canal and external ear normal. There is no impacted cerumen.     Left Ear: Tympanic membrane, ear canal and external ear normal. There is no impacted cerumen.     Nose: Nose normal. No congestion or rhinorrhea.     Mouth/Throat:     Mouth: Mucous membranes are moist.     Pharynx: No oropharyngeal exudate or posterior oropharyngeal erythema.  Eyes:     Pupils: Pupils are equal, round, and reactive to light.  Cardiovascular:     Rate and Rhythm: Normal rate and regular rhythm.  Pulmonary:     Breath sounds: No wheezing, rhonchi or rales.     Wt Readings from Last 3 Encounters:  05/04/23 148 lb (67.1 kg)  04/30/23 151 lb 0.2 oz (68.5 kg)  01/03/23 151 lb (68.5 kg)    BP 118/72   Pulse 82   Ht 5\' 4"  (1.626 m)   Wt 148 lb (67.1 kg)   SpO2 97%   BMI 25.40 kg/m   Assessment and Plan: 1. Benign paroxysmal positional vertigo, unspecified laterality New onset.  Waxing and waning but gradually improving.  Stable.  Patient is gradually having decreased symptomatology of vertigo and we will refill her meclizine that she will have on board on a as needed basis rather than taking continuously every 8 hours.  Recheck of ears is unremarkable with no erythema and no serous fluid patient has been given information on Epley maneuver to be used on her own as needed for symptomatology.  Patient has been instructed return if it continues past  weekend. - meclizine (ANTIVERT) 25 MG tablet; Take 1 tablet (25 mg total) by mouth 3 (three) times daily as needed for dizziness.  Dispense: 20 tablet; Refill: 0     Elizabeth Sauer, MD

## 2023-06-22 ENCOUNTER — Ambulatory Visit (INDEPENDENT_AMBULATORY_CARE_PROVIDER_SITE_OTHER): Payer: 59 | Admitting: Family Medicine

## 2023-06-22 ENCOUNTER — Encounter: Payer: Self-pay | Admitting: Family Medicine

## 2023-06-22 VITALS — BP 118/74 | HR 75 | Ht 64.0 in | Wt 152.0 lb

## 2023-06-22 DIAGNOSIS — R7303 Prediabetes: Secondary | ICD-10-CM

## 2023-06-22 DIAGNOSIS — E7801 Familial hypercholesterolemia: Secondary | ICD-10-CM | POA: Diagnosis not present

## 2023-06-22 MED ORDER — METFORMIN HCL 500 MG PO TABS
500.0000 mg | ORAL_TABLET | Freq: Every day | ORAL | 1 refills | Status: DC
Start: 2023-06-22 — End: 2024-01-18

## 2023-06-22 MED ORDER — ATORVASTATIN CALCIUM 20 MG PO TABS
20.0000 mg | ORAL_TABLET | Freq: Every day | ORAL | 1 refills | Status: DC
Start: 2023-06-22 — End: 2024-01-18

## 2023-06-22 NOTE — Progress Notes (Signed)
Date:  06/22/2023   Name:  Cassidy Wood   DOB:  [birth year]   MRN:  [id number]   Chief Complaint: Diabetes and Hyperlipidemia  Diabetes She presents for her follow-up diabetic visit. She has type 2 diabetes mellitus. Her disease course has been stable. There are no hypoglycemic associated symptoms. There are no diabetic associated symptoms. Pertinent negatives for diabetes include no chest pain. There are no hypoglycemic complications. Symptoms are stable. There are no diabetic complications. There are no known risk factors for coronary artery disease. Current diabetic treatment includes oral agent (monotherapy). She is compliant with treatment some of the time. Her weight is increasing rapidly. She is following a generally healthy diet. Meal planning includes avoidance of concentrated sweets and carbohydrate counting. Her home blood glucose trend is increasing rapidly.  Hyperlipidemia This is a chronic problem. The current episode started more than 1 year ago. The problem is controlled. Recent lipid tests were reviewed and are normal. Pertinent negatives include no chest pain, focal sensory loss, focal weakness, myalgias or shortness of breath. Current antihyperlipidemic treatment includes statins. The current treatment provides moderate improvement of lipids. There are no compliance problems.     Lab Results  Component Value Date   NA 145 (H) 01/04/2023   K 4.5 01/04/2023   CO2 24 01/04/2023   GLUCOSE 119 (H) 01/04/2023   BUN 16 01/04/2023   CREATININE 0.80 01/04/2023   CALCIUM 9.8 01/04/2023   EGFR 84 01/04/2023   GFRNONAA >60 01/25/2022   Lab Results  Component Value Date   CHOL 217 (H) 01/04/2023   HDL 71 01/04/2023   LDLCALC 130 (H) 01/04/2023   TRIG 93 01/04/2023   Lab Results  Component Value Date   TSH 3.57 07/19/2019   Lab Results  Component Value Date   HGBA1C 6.2 (H) 01/04/2023   Lab Results  Component Value Date   WBC 7.3 01/25/2022   HGB 13.6 01/25/2022    HCT 40.5 01/25/2022   MCV 86.2 01/25/2022   PLT 262 01/25/2022   Lab Results  Component Value Date   ALT 30 01/04/2023   AST 29 01/04/2023   ALKPHOS 64 01/04/2023   BILITOT 0.6 01/04/2023   No results found for: "25OHVITD2", "25OHVITD3", "VD25OH"   Review of Systems  HENT:  Negative for congestion.   Eyes:  Negative for visual disturbance.  Respiratory:  Negative for apnea, cough, choking, chest tightness, shortness of breath, wheezing and stridor.   Cardiovascular:  Negative for chest pain, palpitations and leg swelling.  Musculoskeletal:  Negative for myalgias.  Neurological:  Negative for focal weakness.    Patient Active Problem List   Diagnosis Date Noted   ASCUS with positive high risk HPV cervical 07/28/2022    Allergies  Allergen Reactions   Penicillins Swelling    Past Surgical History:  Procedure Laterality Date   LAPAROSCOPY  1985    Social History   Tobacco Use   Smoking status: Former    Current packs/day: 0.00    Types: Cigarettes    Quit date: 08/08/2018    Years since quitting: 4.8   Smokeless tobacco: Never  Vaping Use   Vaping status: Never Used  Substance Use Topics   Alcohol use: Yes    Comment: rarely   Drug use: No     Medication list has been reviewed and updated.  No outpatient medications have been marked as taking for the 06/22/23 encounter (Office Visit) with Duanne Limerick, MD.  06/22/2023    2:21 PM 05/04/2023    4:07 PM 01/03/2023    4:05 PM 07/21/2022   10:52 AM  GAD 7 : Generalized Anxiety Score  Nervous, Anxious, on Edge 2 0 0 0  Control/stop worrying 0 0 0 0  Worry too much - different things 0 0 0 0  Trouble relaxing 0 0 0 0  Restless 0 0 0 0  Easily annoyed or irritable 0 0 0 0  Afraid - awful might happen 0 0 0 0  Total GAD 7 Score 2 0 0 0  Anxiety Difficulty Not difficult at all Not difficult at all Not difficult at all        06/22/2023    2:21 PM 05/04/2023    4:07 PM 01/03/2023    4:05 PM   Depression screen PHQ 2/9  Decreased Interest 0 0 0  Down, Depressed, Hopeless 0 0 0  PHQ - 2 Score 0 0 0  Altered sleeping 0 0 0  Tired, decreased energy 0 0 0  Change in appetite 0 0 0  Feeling bad or failure about yourself  0 0 0  Trouble concentrating 0 0 0  Moving slowly or fidgety/restless 0 0 0  Suicidal thoughts 0 0 0  PHQ-9 Score 0 0 0  Difficult doing work/chores Not difficult at all Not difficult at all Not difficult at all    BP Readings from Last 3 Encounters:  06/22/23 118/74  05/04/23 118/72  04/30/23 105/78    Physical Exam Vitals and nursing note reviewed. Exam conducted with a chaperone present.  Constitutional:      General: She is not in acute distress.    Appearance: She is not diaphoretic.  HENT:     Head: Normocephalic and atraumatic.     Right Ear: Tympanic membrane and external ear normal.     Left Ear: Tympanic membrane and external ear normal.     Nose: Nose normal.     Mouth/Throat:     Mouth: Mucous membranes are moist.  Eyes:     General: Scleral icterus present.        Right eye: No discharge.        Left eye: No discharge.     Conjunctiva/sclera: Conjunctivae normal.     Pupils: Pupils are equal, round, and reactive to light.  Neck:     Thyroid: No thyromegaly.     Vascular: No JVD.  Cardiovascular:     Rate and Rhythm: Normal rate and regular rhythm.     Heart sounds: Normal heart sounds. No murmur heard.    No friction rub. No gallop.  Pulmonary:     Effort: Pulmonary effort is normal.     Breath sounds: Normal breath sounds. No wheezing, rhonchi or rales.  Abdominal:     General: Bowel sounds are normal.     Palpations: Abdomen is soft. There is no mass.     Tenderness: There is no abdominal tenderness. There is no guarding.  Musculoskeletal:        General: Normal range of motion.     Cervical back: Normal range of motion and neck supple.  Lymphadenopathy:     Cervical: No cervical adenopathy.  Skin:    General: Skin is  warm and dry.  Neurological:     Mental Status: She is alert.     Wt Readings from Last 3 Encounters:  06/22/23 152 lb (68.9 kg)  05/04/23 148 lb (67.1 kg)  04/30/23 151 lb 0.2  oz (68.5 kg)    BP 118/74   Pulse 75   Ht 5\' 4"  (1.626 m)   Wt 152 lb (68.9 kg)   SpO2 97%   BMI 26.09 kg/m   Assessment and Plan: 1. Familial hypercholesterolemia Chronic.  Controlled.  Stable.  Asymptomatic.  Tolerating medication well without myalgias.  Continue atorvastatin 20 mg once a day and will check previous lipid panel from 01/04/2023 which notes LDL at 131 which is acceptable at this time and we will recheck at next visit. - atorvastatin (LIPITOR) 20 MG tablet; Take 1 tablet (20 mg total) by mouth daily.  Dispense: 90 tablet; Refill: 1 - Hemoglobin A1c  2. Prediabetes Chronic.  Controlled.  Stable.  Previous A1c that was in 5 1 - metFORMIN (GLUCOPHAGE) 500 MG tablet; Take 1 tablet (500 mg total) by mouth daily with breakfast.  Dispense: 90 tablet; Refill: 1     Elizabeth Sauer, MD

## 2023-06-23 ENCOUNTER — Encounter: Payer: Self-pay | Admitting: Family Medicine

## 2023-06-23 LAB — HEMOGLOBIN A1C
Est. average glucose Bld gHb Est-mCnc: 128 mg/dL
Hgb A1c MFr Bld: 6.1 % — ABNORMAL HIGH (ref 4.8–5.6)

## 2023-07-21 NOTE — Patient Instructions (Signed)
Preventive Care 40-60 Years Old, Female Preventive care refers to lifestyle choices and visits with your health care provider that can promote health and wellness. Preventive care visits are also called wellness exams. What can I expect for my preventive care visit? Counseling Your health care provider may ask you questions about your: Medical history, including: Past medical problems. Family medical history. Pregnancy history. Current health, including: Menstrual cycle. Method of birth control. Emotional well-being. Home life and relationship well-being. Sexual activity and sexual health. Lifestyle, including: Alcohol, nicotine or tobacco, and drug use. Access to firearms. Diet, exercise, and sleep habits. Work and work environment. Sunscreen use. Safety issues such as seatbelt and bike helmet use. Physical exam Your health care provider will check your: Height and weight. These may be used to calculate your BMI (body mass index). BMI is a measurement that tells if you are at a healthy weight. Waist circumference. This measures the distance around your waistline. This measurement also tells if you are at a healthy weight and may help predict your risk of certain diseases, such as type 2 diabetes and high blood pressure. Heart rate and blood pressure. Body temperature. Skin for abnormal spots. What immunizations do I need?  Vaccines are usually given at various ages, according to a schedule. Your health care provider will recommend vaccines for you based on your age, medical history, and lifestyle or other factors, such as travel or where you work. What tests do I need? Screening Your health care provider may recommend screening tests for certain conditions. This may include: Lipid and cholesterol levels. Diabetes screening. This is done by checking your blood sugar (glucose) after you have not eaten for a while (fasting). Pelvic exam and Pap test. Hepatitis B test. Hepatitis C  test. HIV (human immunodeficiency virus) test. STI (sexually transmitted infection) testing, if you are at risk. Lung cancer screening. Colorectal cancer screening. Mammogram. Talk with your health care provider about when you should start having regular mammograms. This may depend on whether you have a family history of breast cancer. BRCA-related cancer screening. This may be done if you have a family history of breast, ovarian, tubal, or peritoneal cancers. Bone density scan. This is done to screen for osteoporosis. Talk with your health care provider about your test results, treatment options, and if necessary, the need for more tests. Follow these instructions at home: Eating and drinking  Eat a diet that includes fresh fruits and vegetables, whole grains, lean protein, and low-fat dairy products. Take vitamin and mineral supplements as recommended by your health care provider. Do not drink alcohol if: Your health care provider tells you not to drink. You are pregnant, may be pregnant, or are planning to become pregnant. If you drink alcohol: Limit how much you have to 0-1 drink a day. Know how much alcohol is in your drink. In the [place] one drink equals one 12 oz bottle of beer (355 mL), one 5 oz glass of wine (148 mL), or one 1 oz glass of hard liquor (44 mL). Lifestyle Brush your teeth every morning and night with fluoride toothpaste. Floss one time each day. Exercise for at least 30 minutes 5 or more days each week. Do not use any products that contain nicotine or tobacco. These products include cigarettes, chewing tobacco, and vaping devices, such as e-cigarettes. If you need help quitting, ask your health care provider. Do not use drugs. If you are sexually active, practice safe sex. Use a condom or other form of protection to   prevent STIs. If you do not wish to become pregnant, use a form of birth control. If you plan to become pregnant, see your health care provider for a  prepregnancy visit. Take aspirin only as told by your health care provider. Make sure that you understand how much to take and what form to take. Work with your health care provider to find out whether it is safe and beneficial for you to take aspirin daily. Find healthy ways to manage stress, such as: Meditation, yoga, or listening to music. Journaling. Talking to a trusted person. Spending time with friends and family. Minimize exposure to UV radiation to reduce your risk of skin cancer. Safety Always wear your seat belt while driving or riding in a vehicle. Do not drive: If you have been drinking alcohol. Do not ride with someone who has been drinking. When you are tired or distracted. While texting. If you have been using any mind-altering substances or drugs. Wear a helmet and other protective equipment during sports activities. If you have firearms in your house, make sure you follow all gun safety procedures. Seek help if you have been physically or sexually abused. What's next? Visit your health care provider once a year for an annual wellness visit. Ask your health care provider how often you should have your eyes and teeth checked. Stay up to date on all vaccines. This information is not intended to replace advice given to you by your health care provider. Make sure you discuss any questions you have with your health care provider. Document Revised: 02/17/2021 Document Reviewed: 02/17/2021 Elsevier Patient Education  2024 Else[employer] Inc. Breast Self-Awareness Breast self-awareness is knowing how your breasts look and feel. You need to: Check your breasts on a regular basis. Tell your doctor about any changes. Become familiar with the look and feel of your breasts. This can help you catch a breast problem while it is still small and can be treated. You should do breast self-exams even if you have breast implants. What you need: A mirror. A well-lit room. A pillow or other  soft object. How to do a breast self-exam Follow these steps to do a breast self-exam: Look for changes  Take off all the clothes above your waist. Stand in front of a mirror in a room with good lighting. Put your hands down at your sides. Compare your breasts in the mirror. Look for any difference between them, such as: A difference in shape. A difference in size. Wrinkles, dips, and bumps in one breast and not the other. Look at each breast for changes in the skin, such as: Redness. Scaly areas. Skin that has gotten thicker. Dimpling. Open sores (ulcers). Look for changes in your nipples, such as: Fluid coming out of a nipple. Fluid around a nipple. Bleeding. Dimpling. Redness. A nipple that looks pushed in (retracted), or that has changed position. Feel for changes Lie on your back. Feel each breast. To do this: Pick a breast to feel. Place a pillow under the shoulder closest to that breast. Put the arm closest to that breast behind your head. Feel the nipple area of that breast using the hand of your other arm. Feel the area with the pads of your three middle fingers by making small circles with your fingers. Use light, medium, and firm pressure. Continue the overlapping circles, moving downward over the breast. Keep making circles with your fingers. Stop when you feel your ribs. Start making circles with your fingers again, this time going   upward until you reach your collarbone. Then, make circles outward across your breast and into your armpit area. Squeeze your nipple. Check for discharge and lumps. Repeat these steps to check your other breast. Sit or stand in the tub or shower. With soapy water on your skin, feel each breast the same way you did when you were lying down. Write down what you find Writing down what you find can help you remember what to tell your doctor. Write down: What is normal for each breast. Any changes you find in each breast. These  include: The kind of changes you find. A tender or painful breast. Any lump you find. Write down its size and where it is. When you last had your monthly period (menstrual cycle). General tips If you are breastfeeding, the best time to check your breasts is after you feed your baby or after you use a breast pump. If you get monthly bleeding, the best time to check your breasts is 5-7 days after your monthly cycle ends. With time, you will become comfortable with the self-exam. You will also start to know if there are changes in your breasts. Contact a doctor if: You see a change in the shape or size of your breasts or nipples. You see a change in the skin of your breast or nipples, such as red or scaly skin. You have fluid coming from your nipples that is not normal. You find a new lump or thick area. You have breast pain. You have any concerns about your breast health. Summary Breast self-awareness includes looking for changes in your breasts and feeling for changes within your breasts. You should do breast self-awareness in front of a mirror in a well-lit room. If you get monthly periods (menstrual cycles), the best time to check your breasts is 5-7 days after your period ends. Tell your doctor about any changes you see in your breasts. Changes include changes in size, changes on the skin, painful or tender breasts, or fluid from your nipples that is not normal. This information is not intended to replace advice given to you by your health care provider. Make sure you discuss any questions you have with your health care provider. Document Revised: 01/27/2022 Document Reviewed: 06/24/2021 Elsevier Patient Education  2024 Elsevier Inc.  

## 2023-07-21 NOTE — Progress Notes (Unsigned)
ANNUAL PREVENTATIVE CARE GYNECOLOGY  ENCOUNTER NOTE  Subjective:       Cassidy Wood is a 62 y.o. G60P2002 female here for a routine annual gynecologic exam. The patient {is/is not/has never been:13135} sexually active. The patient {is/is not:13135} taking hormone replacement therapy. {post-men bleed:13152::"Patient denies post-menopausal vaginal bleeding."} The patient wears seatbelts: {yes/no:311178}. The patient participates in regular exercise: {yes/no/not asked:9010}. Has the patient ever been transfused or tattooed?: {yes/no/not asked:9010}. The patient reports that there {is/is not:9024} domestic violence in her life.  Current complaints: 1.  ***    Gynecologic History No LMP recorded. Patient is postmenopausal. Contraception: status post hysterectomy Last Pap: 07/21/2022. Results were: abnormal, positive for high risk HPV Last mammogram: ***. Results were: {norm/abn:16337} Last Colonoscopy: Never Done Last Dexa Scan:    Obstetric History OB History  Gravida Para Term Preterm AB Living  2 2 2     2   SAB IAB Ectopic Multiple Live Births          2    # Outcome Date GA Lbr Len/2nd Weight Sex Type Anes PTL Lv  2 Term      Vag-Spont     1 Term      Vag-Spont       Past Medical History:  Diagnosis Date   Allergy    Diabetes mellitus without complication (HCC)    Hypercholesterolemia     Family History  Problem Relation Age of Onset   Diabetes Mother    Diabetes Father    COPD Father    Kidney cancer Father 74   Heart disease Maternal Grandfather    Heart disease Paternal Grandfather     Past Surgical History:  Procedure Laterality Date   LAPAROSCOPY  1985    Social History   Socioeconomic History   Marital status: Married    Spouse name: Not on file   Number of children: Not on file   Years of education: Not on file   Highest education level: Not on file  Occupational History   Not on file  Tobacco Use   Smoking status: Former    Current packs/day:  0.00    Types: Cigarettes    Quit date: 08/08/2018    Years since quitting: 4.9   Smokeless tobacco: Never  Vaping Use   Vaping status: Never Used  Substance and Sexual Activity   Alcohol use: Yes    Comment: rarely   Drug use: No   Sexual activity: Yes    Birth control/protection: Post-menopausal  Other Topics Concern   Not on file  Social History Narrative   Not on file   Social Determinants of Health   Financial Resource Strain: Not on file  Food Insecurity: Not on file  Transportation Needs: Not on file  Physical Activity: Not on file  Stress: Not on file  Social Connections: Not on file  Intimate Partner Violence: Not on file    Current Outpatient Medications on File Prior to Visit  Medication Sig Dispense Refill   atorvastatin (LIPITOR) 20 MG tablet Take 1 tablet (20 mg total) by mouth daily. 90 tablet 1   cetirizine (ZYRTEC) 10 MG tablet Take 10 mg by mouth daily.     fluticasone (FLONASE) 50 MCG/ACT nasal spray Place into the nose as needed.     meclizine (ANTIVERT) 25 MG tablet Take 1 tablet (25 mg total) by mouth 3 (three) times daily as needed for dizziness. 20 tablet 0   metFORMIN (GLUCOPHAGE) 500 MG tablet Take 1 tablet (  500 mg total) by mouth daily with breakfast. 90 tablet 1   ondansetron (ZOFRAN-ODT) 8 MG disintegrating tablet Take 1 tablet (8 mg total) by mouth every 8 (eight) hours as needed for nausea or vomiting. 20 tablet 0   vitamin B-12 (CYANOCOBALAMIN) 1000 MCG tablet Take 1,000 mcg by mouth daily.     No current facility-administered medications on file prior to visit.    Allergies  Allergen Reactions   Penicillins Swelling      Review of Systems ROS Review of Systems - General ROS: negative for - chills, fatigue, fever, hot flashes, night sweats, weight gain or weight loss Psychological ROS: negative for - anxiety, decreased libido, depression, mood swings, physical abuse or sexual abuse Ophthalmic ROS: negative for - blurry vision, eye  pain or loss of vision ENT ROS: negative for - headaches, hearing change, visual changes or vocal changes Allergy and Immunology ROS: negative for - hives, itchy/watery eyes or seasonal allergies Hematological and Lymphatic ROS: negative for - bleeding problems, bruising, swollen lymph nodes or weight loss Endocrine ROS: negative for - galactorrhea, hair pattern changes, hot flashes, malaise/lethargy, mood swings, palpitations, polydipsia/polyuria, skin changes, temperature intolerance or unexpected weight changes Breast ROS: negative for - new or changing breast lumps or nipple discharge Respiratory ROS: negative for - cough or shortness of breath Cardiovascular ROS: negative for - chest pain, irregular heartbeat, palpitations or shortness of breath Gastrointestinal ROS: no abdominal pain, change in bowel habits, or black or bloody stools Genito-Urinary ROS: no dysuria, trouble voiding, or hematuria Musculoskeletal ROS: negative for - joint pain or joint stiffness Neurological ROS: negative for - bowel and bladder control changes Dermatological ROS: negative for rash and skin lesion changes   Objective:   There were no vitals taken for this visit. CONSTITUTIONAL: Well-developed, well-nourished female in no acute distress.  PSYCHIATRIC: Normal mood and affect. Normal behavior. Normal judgment and thought content. NEUROLGIC: Alert and oriented to person, place, and time. Normal muscle tone coordination. No cranial nerve deficit noted. HENT:  Normocephalic, atraumatic, External right and left ear normal. Oropharynx is clear and moist EYES: Conjunctivae and EOM are normal. Pupils are equal, round, and reactive to light. No scleral icterus.  NECK: Normal range of motion, supple, no masses.  Normal thyroid.  SKIN: Skin is warm and dry. No rash noted. Not diaphoretic. No erythema. No pallor. CARDIOVASCULAR: Normal heart rate noted, regular rhythm, no murmur. RESPIRATORY: Clear to auscultation  bilaterally. Effort and breath sounds normal, no problems with respiration noted. BREASTS: Symmetric in size. No masses, skin changes, nipple drainage, or lymphadenopathy. ABDOMEN: Soft, normal bowel sounds, no distention noted.  No tenderness, rebound or guarding.  BLADDER: Normal PELVIC:  Bladder {:311640}  Urethra: {:311719}  Vulva: {:311722}  Vagina: {:311643}  Cervix: {:311644}  Uterus: {:311718}  Adnexa: {:311645}  RV: {Blank multiple:19196::"External Exam NormaI","No Rectal Masses","Normal Sphincter tone"}  MUSCULOSKELETAL: Normal range of motion. No tenderness.  No cyanosis, clubbing, or edema.  2+ distal pulses. LYMPHATIC: No Axillary, Supraclavicular, or Inguinal Adenopathy.   Labs: Lab Results  Component Value Date   WBC 7.3 01/25/2022   HGB 13.6 01/25/2022   HCT 40.5 01/25/2022   MCV 86.2 01/25/2022   PLT 262 01/25/2022    Lab Results  Component Value Date   CREATININE 0.80 01/04/2023   BUN 16 01/04/2023   NA 145 (H) 01/04/2023   K 4.5 01/04/2023   CL 105 01/04/2023   CO2 24 01/04/2023    Lab Results  Component Value Date  ALT 30 01/04/2023   AST 29 01/04/2023   ALKPHOS 64 01/04/2023   BILITOT 0.6 01/04/2023    Lab Results  Component Value Date   CHOL 217 (H) 01/04/2023   HDL 71 01/04/2023   LDLCALC 130 (H) 01/04/2023   TRIG 93 01/04/2023    Lab Results  Component Value Date   TSH 3.57 07/19/2019    Lab Results  Component Value Date   HGBA1C 6.1 (H) 06/22/2023     Assessment:   1. Encounter for well woman exam with routine gynecological exam   2. Encounter for screening mammogram for breast cancer      Plan:  Pap:  UTD Mammogram: Ordered Colon Screening:  Ordered Labs:  Done by PCP Routine preventative health maintenance measures emphasized:  Self Breast Exam, Exercise/Diet/Weight control, and Stress Management Flu vaccine status: COVID Vaccination status: Return to Clinic - 1 Year   Paula Compton, CNM [facility]

## 2023-07-24 ENCOUNTER — Ambulatory Visit (INDEPENDENT_AMBULATORY_CARE_PROVIDER_SITE_OTHER): Payer: 59 | Admitting: Obstetrics

## 2023-07-24 ENCOUNTER — Other Ambulatory Visit (HOSPITAL_COMMUNITY)
Admission: RE | Admit: 2023-07-24 | Discharge: 2023-07-24 | Disposition: A | Discharge status: Home / Self Care | Payer: 59 | Source: Ambulatory Visit | Attending: Obstetrics | Admitting: Obstetrics

## 2023-07-24 ENCOUNTER — Encounter: Payer: Self-pay | Admitting: Obstetrics

## 2023-07-24 VITALS — BP 111/45 | HR 75 | Resp 16 | Ht 64.0 in | Wt 154.3 lb

## 2023-07-24 DIAGNOSIS — Z01419 Encounter for gynecological examination (general) (routine) without abnormal findings: Secondary | ICD-10-CM

## 2023-07-24 DIAGNOSIS — Z124 Encounter for screening for malignant neoplasm of cervix: Secondary | ICD-10-CM | POA: Insufficient documentation

## 2023-07-24 DIAGNOSIS — Z1231 Encounter for screening mammogram for malignant neoplasm of breast: Secondary | ICD-10-CM

## 2023-07-26 LAB — CYTOLOGY - PAP: Diagnosis: NEGATIVE

## 2023-08-23 ENCOUNTER — Telehealth: Payer: Self-pay

## 2023-08-23 NOTE — Telephone Encounter (Signed)
Per [facility]:  Hey, this gal never had her mammogram done. According to our records, she has not had one in three years. If you have a free moment, can you call her and tell her I was inquiring on when and whether she would get a mammogram. I highly recommend this.  It is possible that she gets her mammogram done elsewhere.  We can always reorder it for her. Let her know that I was concerned.   Mirna Mires, CNM  08/23/2023 12:39 PM   I was able to get a hold of patient and she said she did not get it done because she was told someone would call her to have this scheduled. She never received a call. She states she is Copy as of 09/06/23 and is not sure if it will cover this. She was on a [employer] silver plan and will be going to a bronze plan. She also states she is not really worried about her breasts, not having any issues currently nor has cancer hx in family. Also, she is going out of [place] at the end of the year and the only day she has to do a mammogram is 09/05/23. Please advise.

## 2023-09-19 ENCOUNTER — Ambulatory Visit: Payer: 59 | Admitting: Family Medicine

## 2023-09-25 ENCOUNTER — Ambulatory Visit: Payer: Self-pay

## 2023-09-25 NOTE — Telephone Encounter (Signed)
  Chief Complaint: COVID positive Symptoms: sore throat, cough, fever, HA, diarrhea and nausea Frequency: yesterday, tested positive today Pertinent Negatives: Patient denies SOB Disposition: [] ED /[] Urgent Care (no appt availability in office) / [x] Appointment(In office/virtual)/ []  [facility]/ [] Home Care/ [] Refused Recommended Disposition /[] [facility]/ []  Follow-up with PCP Additional Notes: pt has been taking Dayquil for sx. Last dose at noon. Advised pt to continue taking along with Tylenol or Ibuprofen as needed for fever and HA. Scheduled pt for VV tomorrow at 0820 with Reuel Boom, [place]. Care advice given and pt verbalized understanding.   Summary: rx req / sore throat   The patient has tested positive for COVID 19 via an at home test today 09/25/23  The patient shares that they began to experience slight sore throat discomfort yesterday  The patient has also began to experience an elevated temperature as well  The patient would like to be prescribed paxlovid for their symptoms  Please contact the patient further when possible at  [phone]     Reason for Disposition  [1] COVID-19 infection suspected by caller or triager AND [2] mild symptoms (cough, fever, or others) AND [3] negative COVID-19 rapid test  Answer Assessment - Initial Assessment Questions 1. COVID-19 DIAGNOSIS: "How do you know that you have COVID?" (e.g., positive lab test or self-test, diagnosed by doctor or NP/PA, symptoms after exposure).     Home test today  3. ONSET: "When did the COVID-19 symptoms start?"      yesterday 5. COUGH: "Do you have a cough?" If Yes, ask: "How bad is the cough?"       yes 6. FEVER: "Do you have a fever?" If Yes, ask: "What is your temperature, how was it measured, and when did it start?"     Low grade fever  7. RESPIRATORY STATUS: "Describe your breathing?" (e.g., normal; shortness of breath, wheezing, unable to speak)      no 9. OTHER SYMPTOMS: "Do you  have any other symptoms?"  (e.g., chills, fatigue, headache, loss of smell or taste, muscle pain, sore throat)     Cough and sore throat, diarrhea and nausea, HA  10. HIGH RISK DISEASE: "Do you have any chronic medical problems?" (e.g., asthma, heart or lung disease, weak immune system, obesity, etc.)       Diabetes  Protocols used: Coronavirus (COVID-19) Diagnosed or Suspected-A-AH

## 2023-09-26 ENCOUNTER — Encounter: Payer: Self-pay | Admitting: Physician Assistant

## 2023-09-26 ENCOUNTER — Telehealth (INDEPENDENT_AMBULATORY_CARE_PROVIDER_SITE_OTHER): Payer: 59 | Admitting: Physician Assistant

## 2023-09-26 DIAGNOSIS — U071 COVID-19: Secondary | ICD-10-CM | POA: Diagnosis not present

## 2023-09-26 MED ORDER — NIRMATRELVIR/RITONAVIR (PAXLOVID)TABLET
3.0000 | ORAL_TABLET | Freq: Two times a day (BID) | ORAL | 0 refills | Status: AC
Start: 2023-09-26 — End: 2023-10-01

## 2023-09-26 MED ORDER — BENZONATATE 200 MG PO CAPS: 200.0000 mg | ORAL_CAPSULE | Freq: Three times a day (TID) | ORAL | 0 refills | Status: DC | PRN

## 2023-09-26 NOTE — Progress Notes (Signed)
Date:  09/26/2023   Name:  Cassidy Wood   DOB:  [birth year]   MRN:  [id number]   I connected with Marleena Peatross on 09/26/23 via MyChart Video and verified that I am speaking with the correct person using appropriate identifiers. The limitations, risks, security and privacy concerns of performing an evaluation and management service by MyChart Video, including the higher likelihood of inaccurate diagnoses and treatments, and the availability of in person appointments were reviewed. The possible need of an additional face-to-face encounter for complete and high quality delivery of care was discussed. The patient was also made aware that there may be a patient responsible charge related to this service. The patient expressed understanding and wishes to proceed.   Provider location is in medical facility [facility] Medicine at [facility]). Patient location is at their home People involved in care of the patient during this telehealth encounter were myself, my CMA, and my front office/scheduling team member.    Chief Complaint: Covid Positive (Symptoms started Sunday, test positive yesterday, cough with some mucous, headache, did have fever but we down, chills and body ache, taking ibuprofen and tylenol )  HPI Bonnie presents virtually today for evaluation of acute URI symptoms as described above, tested positive for COVID yesterday, desiring prescription for Paxlovid.  She has never taken Paxlovid before.  Also finds benefit from benzonatate historically.   Medication list has been reviewed and updated.  Current Meds  Medication Sig   atorvastatin (LIPITOR) 20 MG tablet Take 1 tablet (20 mg total) by mouth daily.   benzonatate (TESSALON) 200 MG capsule Take 1 capsule (200 mg total) by mouth 3 (three) times daily as needed for cough.   meclizine (ANTIVERT) 25 MG tablet Take 1 tablet (25 mg total) by mouth 3 (three) times daily as needed for dizziness.   metFORMIN  (GLUCOPHAGE) 500 MG tablet Take 1 tablet (500 mg total) by mouth daily with breakfast.   nirmatrelvir/ritonavir (PAXLOVID) 20 x 150 MG & 10 x 100MG  TABS Take 3 tablets by mouth 2 (two) times daily for 5 days. (Take nirmatrelvir 150 mg two tablets twice daily for 5 days and ritonavir 100 mg one tablet twice daily for 5 days) Patient GFR is 84   ondansetron (ZOFRAN-ODT) 8 MG disintegrating tablet Take 1 tablet (8 mg total) by mouth every 8 (eight) hours as needed for nausea or vomiting.   vitamin B-12 (CYANOCOBALAMIN) 1000 MCG tablet Take 1,000 mcg by mouth daily.     Review of Systems  Patient Active Problem List   Diagnosis Date Noted   ASCUS with positive high risk HPV cervical 07/28/2022    Allergies  Allergen Reactions   Penicillins Swelling    Immunization History  Administered Date(s) Administered   Influenza,inj,Quad PF,6+ Mos 05/20/2022   PFIZER(Purple Top)SARS-COV-2 Vaccination 11/22/2019, 12/17/2019, 07/30/2020, 12/31/2020, 08/16/2021   Pfizer(Comirnaty)Fall Seasonal Vaccine 12 years and older 07/05/2022    Past Surgical History:  Procedure Laterality Date   LAPAROSCOPY  1985    Social History   Tobacco Use   Smoking status: Former    Current packs/day: 0.00    Types: Cigarettes    Quit date: 08/08/2018    Years since quitting: 5.1   Smokeless tobacco: Never  Vaping Use   Vaping status: Never Used  Substance Use Topics   Alcohol use: Yes    Comment: rarely   Drug use: No    Family History  Problem Relation Age of Onset  Diabetes Mother    Diabetes Father    COPD Father    Kidney cancer Father 61   Heart disease Maternal Grandfather    Heart disease Paternal Grandfather         09/26/2023    8:11 AM 06/22/2023    2:21 PM 05/04/2023    4:07 PM 01/03/2023    4:05 PM  GAD 7 : Generalized Anxiety Score  Nervous, Anxious, on Edge 0 2 0 0  Control/stop worrying 0 0 0 0  Worry too much - different things 0 0 0 0  Trouble relaxing 0 0 0 0  Restless 0  0 0 0  Easily annoyed or irritable 0 0 0 0  Afraid - awful might happen 0 0 0 0  Total GAD 7 Score 0 2 0 0  Anxiety Difficulty Not difficult at all Not difficult at all Not difficult at all Not difficult at all       09/26/2023    8:11 AM 07/24/2023    8:23 AM 06/22/2023    2:21 PM  Depression screen PHQ 2/9  Decreased Interest 0 0 0  Down, Depressed, Hopeless 0 0 0  PHQ - 2 Score 0 0 0  Altered sleeping   0  Tired, decreased energy   0  Change in appetite   0  Feeling bad or failure about yourself    0  Trouble concentrating   0  Moving slowly or fidgety/restless   0  Suicidal thoughts   0  PHQ-9 Score   0  Difficult doing work/chores   Not difficult at all    BP Readings from Last 3 Encounters:  07/24/23 (!) 111/45  06/22/23 118/74  05/04/23 118/72    Wt Readings from Last 3 Encounters:  07/24/23 154 lb 4.8 oz (70 kg)  06/22/23 152 lb (68.9 kg)  05/04/23 148 lb (67.1 kg)    There were no vitals taken for this visit.  Physical Exam General: Speaking full sentences, no audible heavy breathing. Sounds alert and appropriately interactive. Well-appearing. Face symmetric. Extraocular movements intact. Pupils equal and round. No nasal flaring or accessory muscle use visualized.  Recent Labs     Component Value Date/Time   NA 145 (H) 01/04/2023 0857   K 4.5 01/04/2023 0857   CL 105 01/04/2023 0857   CO2 24 01/04/2023 0857   GLUCOSE 119 (H) 01/04/2023 0857   GLUCOSE 104 (H) 01/25/2022 0955   BUN 16 01/04/2023 0857   CREATININE 0.80 01/04/2023 0857   CALCIUM 9.8 01/04/2023 0857   PROT 6.9 01/04/2023 0857   ALBUMIN 4.6 01/04/2023 0857   AST 29 01/04/2023 0857   ALT 30 01/04/2023 0857   ALKPHOS 64 01/04/2023 0857   BILITOT 0.6 01/04/2023 0857   GFRNONAA >60 01/25/2022 0955    Lab Results  Component Value Date   WBC 7.3 01/25/2022   HGB 13.6 01/25/2022   HCT 40.5 01/25/2022   MCV 86.2 01/25/2022   PLT 262 01/25/2022   Lab Results  Component Value Date    HGBA1C 6.1 (H) 06/22/2023   Lab Results  Component Value Date   CHOL 217 (H) 01/04/2023   HDL 71 01/04/2023   LDLCALC 130 (H) 01/04/2023   TRIG 93 01/04/2023   Lab Results  Component Value Date   TSH 3.57 07/19/2019     Assessment and Plan:  1. Acute COVID-19 (Primary) Due to comorbidity of diabetes, patient qualifies for Paxlovid.  Prescribed today as below, but patient advised that  if the prescription is very costly, she will likely recover without it just fine given her prior immunizations to COVID-19.  Advised to hold atorvastatin while on this medication and for 1-2 days following its completion.   - nirmatrelvir/ritonavir (PAXLOVID) 20 x 150 MG & 10 x 100MG  TABS; Take 3 tablets by mouth 2 (two) times daily for 5 days. (Take nirmatrelvir 150 mg two tablets twice daily for 5 days and ritonavir 100 mg one tablet twice daily for 5 days) Patient GFR is 84  Dispense: 30 tablet; Refill: 0 - benzonatate (TESSALON) 200 MG capsule; Take 1 capsule (200 mg total) by mouth 3 (three) times daily as needed for cough.  Dispense: 30 capsule; Refill: 0    I discussed the above assessment and treatment plan with the patient. The patient was provided an opportunity to ask questions and all were answered. The patient agreed with the plan and demonstrated an understanding of the instructions. The patient was advised to call back or seek an in-person evaluation if the symptoms worsen or if the condition fails to improve as anticipated. I provided a total time of 15 minutes inclusive of time utilized for medical chart review, information gathering, care coordination with staff, and documentation completion.  Alvester Morin, PA-C, DMSc, Nutritionist [facility] [phone]

## 2023-09-26 NOTE — Patient Instructions (Signed)
-

## 2024-01-18 ENCOUNTER — Other Ambulatory Visit: Payer: Self-pay | Admitting: Family Medicine

## 2024-01-18 DIAGNOSIS — E7801 Familial hypercholesterolemia: Secondary | ICD-10-CM

## 2024-01-18 DIAGNOSIS — E78019 Familial hypercholesterolemia, unspecified: Secondary | ICD-10-CM

## 2024-01-18 DIAGNOSIS — R7303 Prediabetes: Secondary | ICD-10-CM

## 2024-01-22 ENCOUNTER — Other Ambulatory Visit: Payer: Self-pay | Admitting: Family Medicine

## 2024-01-22 DIAGNOSIS — E7801 Familial hypercholesterolemia: Secondary | ICD-10-CM

## 2024-01-24 NOTE — Telephone Encounter (Signed)
 Attempted to contact patient for 2nd attempt and recording call can not be completed as dialed. Unable to leave message to schedule appt for medication refills.

## 2024-01-24 NOTE — Telephone Encounter (Signed)
 Requested by interface surescripts. Needs appt for future refills, PCP retiring/ Last OV 06/22/23. Requested Prescriptions  Pending Prescriptions Disp Refills   atorvastatin  (LIPITOR) 20 MG tablet [Pharmacy Med Name: Atorvastatin  Calcium  20 MG Oral Tablet] 90 tablet 0    Sig: Take 1 tablet by mouth once daily     Cardiovascular:  Antilipid - Statins Failed - 01/24/2024 11:57 AM      Failed - Valid encounter within last 12 months    Recent Outpatient Visits   None            Failed - Lipid Panel in normal range within the last 12 months    Cholesterol, Total  Date Value Ref Range Status  01/04/2023 217 (H) 100 - 199 mg/dL Final   LDL Chol Calc (NIH)  Date Value Ref Range Status  01/04/2023 130 (H) 0 - 99 mg/dL Final   HDL  Date Value Ref Range Status  01/04/2023 71 >39 mg/dL Final   Triglycerides  Date Value Ref Range Status  01/04/2023 93 0 - 149 mg/dL Final         Passed - Patient is not pregnant

## 2024-01-24 NOTE — Telephone Encounter (Signed)
 Attempted to contact patient to schedule OV with other provider for future medication refills , due to PCP retiring. Busy signal noted and unable to leave message.

## 2024-04-16 DIAGNOSIS — E119 Type 2 diabetes mellitus without complications: Secondary | ICD-10-CM | POA: Diagnosis not present

## 2024-04-16 LAB — HM DIABETES EYE EXAM

## 2024-04-22 ENCOUNTER — Other Ambulatory Visit: Payer: Self-pay | Admitting: Physician Assistant

## 2024-04-22 DIAGNOSIS — R7303 Prediabetes: Secondary | ICD-10-CM

## 2024-04-22 DIAGNOSIS — E7801 Familial hypercholesterolemia: Secondary | ICD-10-CM

## 2024-04-22 MED ORDER — ATORVASTATIN CALCIUM 20 MG PO TABS: 20.0000 mg | ORAL_TABLET | Freq: Every day | ORAL | 0 refills | Status: DC

## 2024-04-22 MED ORDER — METFORMIN HCL 500 MG PO TABS
500.0000 mg | ORAL_TABLET | Freq: Every day | ORAL | 0 refills | Status: DC
Start: 2024-04-22 — End: 2024-05-02

## 2024-04-22 NOTE — Telephone Encounter (Unsigned)
 Copied from CRM #[id number]. Topic: Clinical - Medication Refill >> Apr 22, 2024  9:43 AM Delon T wrote: Patient out of medication after today Medication: atorvastatin  (LIPITOR) 20 MG tablet  metFORMIN  (GLUCOPHAGE ) 500 MG tablet  Has the patient contacted their pharmacy? Yes (Agent: If no, request that the patient contact the pharmacy for the refill. If patient does not wish to contact the pharmacy document the reason why and proceed with request.) (Agent: If yes, when and what did the pharmacy advise?)  This is the patient's preferred pharmacy:  [facility] [address] [place] - [address] [place] [address] Phone: [phone] Fax: [phone]  Is this the correct pharmacy for this prescription? Yes If no, delete pharmacy and type the correct one.   Has the prescription been filled recently? Yes  Is the patient out of the medication? Yes  Has the patient been seen for an appointment in the last year OR does the patient have an upcoming appointment? Yes  Can we respond through MyChart? Yes  Agent: Please be advised that Rx refills may take up to 3 business days. We ask that you follow-up with your pharmacy.

## 2024-05-02 ENCOUNTER — Encounter: Payer: Self-pay | Admitting: Physician Assistant

## 2024-05-02 ENCOUNTER — Ambulatory Visit: Admitting: Physician Assistant

## 2024-05-02 VITALS — BP 102/64 | HR 73 | Temp 98.5°F | Ht 64.0 in | Wt 152.0 lb

## 2024-05-02 DIAGNOSIS — E7800 Pure hypercholesterolemia, unspecified: Secondary | ICD-10-CM

## 2024-05-02 DIAGNOSIS — E1169 Type 2 diabetes mellitus with other specified complication: Secondary | ICD-10-CM | POA: Diagnosis not present

## 2024-05-02 DIAGNOSIS — E785 Hyperlipidemia, unspecified: Secondary | ICD-10-CM

## 2024-05-02 LAB — POCT GLYCOSYLATED HEMOGLOBIN (HGB A1C): Hemoglobin A1C: 5.7 % — AB (ref 4.0–5.6)

## 2024-05-02 NOTE — Assessment & Plan Note (Signed)
 A1c 5.7% today, which is the best it has been in years.  Okay to stop metformin  and repeat A1c in 3 months, but patient advised to work on strength/resistance training with a recommended starting goal of 2 sessions per week, 20 minutes each.  She is agreeable to this.  Due for routine labs including microalbumin

## 2024-05-02 NOTE — Progress Notes (Signed)
 Date:  05/02/2024   Name:  Ana Dollins   DOB:  Apr 04, 1961   MRN:  [id number]   Chief Complaint: Diabetes  HPI Jaynee is a very pleasant 63 year old female with a history of well-controlled diabetes and HLD who returns to clinic today as a transfer of care from my recently retired Animator Dr. Cathryne Molt.  Presently she is on metformin  monotherapy for her diabetes, just 500 mg daily.  Last A1c 6.1% October 2024.  Relatively active with regular walking and part-time work at [employer], no strength training to speak of.  She is interested in reducing her medication burden, wonders if she might be able to stop metformin  and/or atorvastatin .  Due for labs today.   Medication list has been reviewed and updated.  Current Meds  Medication Sig   atorvastatin  (LIPITOR) 20 MG tablet Take 1 tablet (20 mg total) by mouth daily.   Cetirizine HCl (ZYRTEC PO) Take by mouth.   meclizine  (ANTIVERT ) 25 MG tablet Take 1 tablet (25 mg total) by mouth 3 (three) times daily as needed for dizziness.   ondansetron  (ZOFRAN -ODT) 8 MG disintegrating tablet Take 1 tablet (8 mg total) by mouth every 8 (eight) hours as needed for nausea or vomiting.   vitamin B-12 (CYANOCOBALAMIN) 1000 MCG tablet Take 1,000 mcg by mouth daily.   [DISCONTINUED] metFORMIN  (GLUCOPHAGE ) 500 MG tablet Take 1 tablet (500 mg total) by mouth daily with breakfast.     Review of Systems  Patient Active Problem List   Diagnosis Date Noted   Diet controlled type 2 diabetes mellitus with hyperlipidemia (HCC) 05/02/2024   Pure hypercholesterolemia 05/02/2024   ASCUS with positive high risk HPV cervical 07/28/2022    Allergies  Allergen Reactions   Penicillins Swelling    Immunization History  Administered Date(s) Administered   Influenza,inj,Quad PF,6+ Mos 05/20/2022   PFIZER(Purple Top)SARS-COV-2 Vaccination 11/22/2019, 12/17/2019, 07/30/2020, 12/31/2020, 08/16/2021   Pfizer(Comirnaty)Fall Seasonal Vaccine 12 years and  older 07/05/2022    Past Surgical History:  Procedure Laterality Date   LAPAROSCOPY  1985    Social History   Tobacco Use   Smoking status: Former    Current packs/day: 0.00    Average packs/day: 1 pack/day for 36.9 years (36.9 ttl pk-yrs)    Types: Cigarettes    Start date: 48    Quit date: 08/08/2018    Years since quitting: 5.7   Smokeless tobacco: Never   Tobacco comments:    Did quit when pregnant with children   Vaping Use   Vaping status: Never Used  Substance Use Topics   Alcohol use: Yes    Comment: rarely   Drug use: No    Family History  Problem Relation Age of Onset   Diabetes Mother    Diabetes Father    COPD Father    Kidney cancer Father 44   Heart disease Maternal Grandfather    Heart disease Paternal Grandfather         05/02/2024    2:17 PM 09/26/2023    8:11 AM 06/22/2023    2:21 PM 05/04/2023    4:07 PM  GAD 7 : Generalized Anxiety Score  Nervous, Anxious, on Edge 0 0 2 0  Control/stop worrying 0 0 0 0  Worry too much - different things 0 0 0 0  Trouble relaxing 0 0 0 0  Restless 0 0 0 0  Easily annoyed or irritable 0 0 0 0  Afraid - awful might happen 0 0 0 0  Total GAD 7 Score 0 0 2 0  Anxiety Difficulty Not difficult at all Not difficult at all Not difficult at all Not difficult at all       05/02/2024    2:17 PM 09/26/2023    8:11 AM 07/24/2023    8:23 AM  Depression screen PHQ 2/9  Decreased Interest 0 0 0  Down, Depressed, Hopeless 0 0 0  PHQ - 2 Score 0 0 0    BP Readings from Last 3 Encounters:  05/02/24 102/64  07/24/23 (!) 111/45  06/22/23 118/74    Wt Readings from Last 3 Encounters:  05/02/24 152 lb (68.9 kg)  07/24/23 154 lb 4.8 oz (70 kg)  06/22/23 152 lb (68.9 kg)    BP 102/64   Pulse 73   Temp 98.5 F (36.9 C)   Ht 5' 4 (1.626 m)   Wt 152 lb (68.9 kg)   SpO2 98%   BMI 26.09 kg/m   Physical Exam Vitals and nursing note reviewed.  Constitutional:      Appearance: Normal appearance.  Neck:      Vascular: No carotid bruit.  Cardiovascular:     Rate and Rhythm: Normal rate and regular rhythm.     Heart sounds: No murmur heard.    No friction rub. No gallop.  Pulmonary:     Effort: Pulmonary effort is normal.     Breath sounds: Normal breath sounds.  Abdominal:     General: There is no distension.  Musculoskeletal:        General: Normal range of motion.  Skin:    General: Skin is warm and dry.  Neurological:     Mental Status: She is alert and oriented to person, place, and time.     Gait: Gait is intact.  Psychiatric:        Mood and Affect: Mood and affect normal.     Recent Labs     Component Value Date/Time   NA 145 (H) 01/04/2023 0857   K 4.5 01/04/2023 0857   CL 105 01/04/2023 0857   CO2 24 01/04/2023 0857   GLUCOSE 119 (H) 01/04/2023 0857   GLUCOSE 104 (H) 01/25/2022 0955   BUN 16 01/04/2023 0857   CREATININE 0.80 01/04/2023 0857   CALCIUM  9.8 01/04/2023 0857   PROT 6.9 01/04/2023 0857   ALBUMIN 4.6 01/04/2023 0857   AST 29 01/04/2023 0857   ALT 30 01/04/2023 0857   ALKPHOS 64 01/04/2023 0857   BILITOT 0.6 01/04/2023 0857   GFRNONAA >60 01/25/2022 0955    Lab Results  Component Value Date   WBC 7.3 01/25/2022   HGB 13.6 01/25/2022   HCT 40.5 01/25/2022   MCV 86.2 01/25/2022   PLT 262 01/25/2022   Lab Results  Component Value Date   HGBA1C 5.7 (A) 05/02/2024   HGBA1C 6.1 (H) 06/22/2023   HGBA1C 6.2 (H) 01/04/2023   Lab Results  Component Value Date   CHOL 217 (H) 01/04/2023   HDL 71 01/04/2023   LDLCALC 130 (H) 01/04/2023   TRIG 93 01/04/2023   Lab Results  Component Value Date   TSH 3.57 07/19/2019      Assessment and Plan:  Diet-controlled type 2 diabetes mellitus with hyperlipidemia (HCC) Assessment & Plan: A1c 5.7% today, which is the best it has been in years.  Okay to stop metformin  and repeat A1c in 3 months, but patient advised to work on strength/resistance training with a recommended starting goal of 2 sessions per  week,  20 minutes each.  She is agreeable to this.  Due for routine labs including microalbumin  Orders: -     POCT glycosylated hemoglobin (Hb A1C) -     CBC with Differential/Platelet -     Comprehensive metabolic panel with GFR -     Lipid panel -     Microalbumin / creatinine urine ratio  Pure hypercholesterolemia Assessment & Plan: Check nonfasting lipids, may adjust atorvastatin  dose pending lab results  Orders: -     Lipid panel     Return in about 3 months (around 08/02/2024) for OV f/u chronic conditions.    Rolan Hoyle, PA-C, DMSc, Nutritionist [facility] Medicine [facility] [phone]

## 2024-05-02 NOTE — Assessment & Plan Note (Signed)
 Check nonfasting lipids, may adjust atorvastatin  dose pending lab results

## 2024-05-03 ENCOUNTER — Telehealth: Payer: Self-pay | Admitting: Physician Assistant

## 2024-05-03 ENCOUNTER — Ambulatory Visit: Payer: Self-pay | Admitting: Physician Assistant

## 2024-05-03 ENCOUNTER — Other Ambulatory Visit: Payer: Self-pay | Admitting: Physician Assistant

## 2024-05-03 DIAGNOSIS — E7801 Familial hypercholesterolemia: Secondary | ICD-10-CM

## 2024-05-03 LAB — CBC WITH DIFFERENTIAL/PLATELET
Basophils Absolute: 0.1 x10E3/uL (ref 0.0–0.2)
Basos: 1 %
EOS (ABSOLUTE): 0.1 x10E3/uL (ref 0.0–0.4)
Eos: 1 %
Hematocrit: 41.8 % (ref 34.0–46.6)
Hemoglobin: 13.3 g/dL (ref 11.1–15.9)
Immature Grans (Abs): 0 x10E3/uL (ref 0.0–0.1)
Immature Granulocytes: 0 %
Lymphocytes Absolute: 3.5 x10E3/uL — ABNORMAL HIGH (ref 0.7–3.1)
Lymphs: 36 %
MCH: 28.8 pg (ref 26.6–33.0)
MCHC: 31.8 g/dL (ref 31.5–35.7)
MCV: 91 fL (ref 79–97)
Monocytes Absolute: 0.7 x10E3/uL (ref 0.1–0.9)
Monocytes: 8 %
Neutrophils Absolute: 5.3 x10E3/uL (ref 1.4–7.0)
Neutrophils: 54 %
Platelets: 302 x10E3/uL (ref 150–450)
RBC: 4.62 x10E6/uL (ref 3.77–5.28)
RDW: 13.1 % (ref 11.7–15.4)
WBC: 9.7 x10E3/uL (ref 3.4–10.8)

## 2024-05-03 LAB — COMPREHENSIVE METABOLIC PANEL WITH GFR
ALT: 18 IU/L (ref 0–32)
AST: 21 IU/L (ref 0–40)
Albumin: 4.8 g/dL (ref 3.9–4.9)
Alkaline Phosphatase: 70 IU/L (ref 44–121)
BUN/Creatinine Ratio: 19 (ref 12–28)
BUN: 15 mg/dL (ref 8–27)
Bilirubin Total: 0.3 mg/dL (ref 0.0–1.2)
CO2: 22 mmol/L (ref 20–29)
Calcium: 9.9 mg/dL (ref 8.7–10.3)
Chloride: 104 mmol/L (ref 96–106)
Creatinine, Ser: 0.78 mg/dL (ref 0.57–1.00)
Globulin, Total: 2.6 g/dL (ref 1.5–4.5)
Glucose: 76 mg/dL (ref 70–99)
Potassium: 4.1 mmol/L (ref 3.5–5.2)
Sodium: 142 mmol/L (ref 134–144)
Total Protein: 7.4 g/dL (ref 6.0–8.5)
eGFR: 86 mL/min/1.73 (ref >59)

## 2024-05-03 LAB — MICROALBUMIN / CREATININE URINE RATIO
Creatinine, Urine: 96.1 mg/dL
Microalb/Creat Ratio: 4 mg/g{creat} (ref 0–29)
Microalbumin, Urine: 3.6 ug/mL

## 2024-05-03 LAB — LIPID PANEL
Chol/HDL Ratio: 2.7 ratio (ref 0.0–4.4)
Cholesterol, Total: 194 mg/dL (ref 100–199)
HDL: 71 mg/dL (ref >39)
LDL Chol Calc (NIH): 90 mg/dL (ref 0–99)
Triglycerides: 201 mg/dL — ABNORMAL HIGH (ref 0–149)
VLDL Cholesterol Cal: 33 mg/dL (ref 5–40)

## 2024-05-03 MED ORDER — ATORVASTATIN CALCIUM 10 MG PO TABS
10.0000 mg | ORAL_TABLET | Freq: Every day | ORAL | 1 refills | Status: AC
Start: 2024-05-03 — End: ?

## 2024-05-03 NOTE — Telephone Encounter (Signed)
 Spoke with patient. Labs held up by microalbumin, but found the lipids in [facility] link. LDL 90, HDL 71. Okay to lower atorvastatin  to 10 mg. New rx sent to pharmacy.

## 2024-05-03 NOTE — Telephone Encounter (Signed)
 Copied from CRM #[id number]. Topic: Clinical - Prescription Issue >> May 03, 2024  2:25 PM Zebedee SAUNDERS wrote: Reason for CRM: Pt meet with Manya PA regarding atorvastatin  (LIPITOR) 20 MG tablet, if he was changing dosage and [place] will be out of [place] and if he will refill script so pt will have enough for trip.Pt stated clinic was suppose to call this morning but she has not heard back. Please call pt at [phone].

## 2024-05-03 NOTE — Telephone Encounter (Signed)
 Please review.  KP

## 2024-08-07 ENCOUNTER — Encounter: Payer: Self-pay | Admitting: Physician Assistant

## 2024-08-07 ENCOUNTER — Ambulatory Visit: Admitting: Physician Assistant

## 2024-08-07 VITALS — BP 120/76 | HR 81 | Temp 98.2°F | Ht 64.0 in | Wt 158.0 lb

## 2024-08-07 DIAGNOSIS — Z7984 Long term (current) use of oral hypoglycemic drugs: Secondary | ICD-10-CM

## 2024-08-07 DIAGNOSIS — E785 Hyperlipidemia, unspecified: Secondary | ICD-10-CM

## 2024-08-07 DIAGNOSIS — E1169 Type 2 diabetes mellitus with other specified complication: Secondary | ICD-10-CM | POA: Diagnosis not present

## 2024-08-07 LAB — POCT GLYCOSYLATED HEMOGLOBIN (HGB A1C): Hemoglobin A1C: 6.2 % — AB (ref 4.0–5.6)

## 2024-08-07 NOTE — Progress Notes (Signed)
 Date:  08/07/2024   Name:  Cassidy Wood   DOB:  1961/01/17   MRN:  [id number]   Chief Complaint: Medical Management of Chronic Issues  HPI  Cassidy Wood returns for 59-month follow-up on DM2 and HLD after stopping metformin  last visit (A1c 5.7%) and decreasing atorvastatin  dose to 10 mg daily by patient request. She is not fasting today for labs.  She admits she has not started resistance training and has slipped on her nutrition, especially with the holiday season.    Medication list has been reviewed and updated.  Current Meds  Medication Sig   atorvastatin  (LIPITOR) 10 MG tablet Take 1 tablet (10 mg total) by mouth daily.   Cetirizine HCl (ZYRTEC PO) Take by mouth.   meclizine  (ANTIVERT ) 25 MG tablet Take 1 tablet (25 mg total) by mouth 3 (three) times daily as needed for dizziness.   ondansetron  (ZOFRAN -ODT) 8 MG disintegrating tablet Take 1 tablet (8 mg total) by mouth every 8 (eight) hours as needed for nausea or vomiting.   vitamin B-12 (CYANOCOBALAMIN) 1000 MCG tablet Take 1,000 mcg by mouth daily.     Review of Systems  Patient Active Problem List   Diagnosis Date Noted   Diet controlled type 2 diabetes mellitus with hyperlipidemia (HCC) 05/02/2024   Pure hypercholesterolemia 05/02/2024    Allergies  Allergen Reactions   Penicillins Swelling    Immunization History  Administered Date(s) Administered   Influenza,inj,Quad PF,6+ Mos 05/20/2022   PFIZER(Purple Top)SARS-COV-2 Vaccination 11/22/2019, 12/17/2019, 07/30/2020, 12/31/2020, 08/16/2021   Pfizer(Comirnaty)Fall Seasonal Vaccine 12 years and older 07/05/2022    Past Surgical History:  Procedure Laterality Date   LAPAROSCOPY  1985    Social History   Tobacco Use   Smoking status: Former    Current packs/day: 0.00    Average packs/day: 1 pack/day for 36.9 years (36.9 ttl pk-yrs)    Types: Cigarettes    Start date: 67    Quit date: 08/08/2018    Years since quitting: 6.0   Smokeless tobacco: Never    Tobacco comments:    Did quit when pregnant with children   Vaping Use   Vaping status: Never Used  Substance Use Topics   Alcohol use: Yes    Comment: rarely   Drug use: No    Family History  Problem Relation Age of Onset   Diabetes Mother    Diabetes Father    COPD Father    Kidney cancer Father 78   Heart disease Maternal Grandfather    Heart disease Paternal Grandfather         08/07/2024    2:40 PM 05/02/2024    2:17 PM 09/26/2023    8:11 AM 06/22/2023    2:21 PM  GAD 7 : Generalized Anxiety Score  Nervous, Anxious, on Edge 0 0 0 2  Control/stop worrying 0 0 0 0  Worry too much - different things 0 0 0 0  Trouble relaxing 0 0 0 0  Restless 0 0 0 0  Easily annoyed or irritable 0 0 0 0  Afraid - awful might happen 0 0 0 0  Total GAD 7 Score 0 0 0 2  Anxiety Difficulty Not difficult at all Not difficult at all Not difficult at all Not difficult at all       08/07/2024    2:40 PM 05/02/2024    2:17 PM 09/26/2023    8:11 AM  Depression screen PHQ 2/9  Decreased Interest 0 0 0  Down, Depressed,  Hopeless 0 0 0  PHQ - 2 Score 0 0 0    BP Readings from Last 3 Encounters:  08/07/24 120/76  05/02/24 102/64  07/24/23 (!) 111/45    Wt Readings from Last 3 Encounters:  08/07/24 158 lb (71.7 kg)  05/02/24 152 lb (68.9 kg)  07/24/23 154 lb 4.8 oz (70 kg)    BP 120/76   Pulse 81   Temp 98.2 F (36.8 C)   Ht 5' 4 (1.626 m)   Wt 158 lb (71.7 kg)   SpO2 97%   BMI 27.12 kg/m   Physical Exam Vitals and nursing note reviewed.  Constitutional:      Appearance: Normal appearance.  Cardiovascular:     Rate and Rhythm: Normal rate.  Pulmonary:     Effort: Pulmonary effort is normal.  Abdominal:     General: There is no distension.  Musculoskeletal:        General: Normal range of motion.  Skin:    General: Skin is warm and dry.  Neurological:     Mental Status: She is alert and oriented to person, place, and time.     Gait: Gait is intact.  Psychiatric:         Mood and Affect: Mood and affect normal.     Recent Labs     Component Value Date/Time   NA 142 05/02/2024 1500   K 4.1 05/02/2024 1500   CL 104 05/02/2024 1500   CO2 22 05/02/2024 1500   GLUCOSE 76 05/02/2024 1500   GLUCOSE 104 (H) 01/25/2022 0955   BUN 15 05/02/2024 1500   CREATININE 0.78 05/02/2024 1500   CALCIUM  9.9 05/02/2024 1500   PROT 7.4 05/02/2024 1500   ALBUMIN 4.8 05/02/2024 1500   AST 21 05/02/2024 1500   ALT 18 05/02/2024 1500   ALKPHOS 70 05/02/2024 1500   BILITOT 0.3 05/02/2024 1500   GFRNONAA >60 01/25/2022 0955    Lab Results  Component Value Date   WBC 9.7 05/02/2024   HGB 13.3 05/02/2024   HCT 41.8 05/02/2024   MCV 91 05/02/2024   PLT 302 05/02/2024   Lab Results  Component Value Date   HGBA1C 6.2 (A) 08/07/2024   HGBA1C 5.7 (A) 05/02/2024   HGBA1C 6.1 (H) 06/22/2023   Lab Results  Component Value Date   CHOL 194 05/02/2024   HDL 71 05/02/2024   LDLCALC 90 05/02/2024   TRIG 201 (H) 05/02/2024   CHOLHDL 2.7 05/02/2024   Lab Results  Component Value Date   TSH 3.57 07/19/2019      Assessment and Plan:  Diet controlled type 2 diabetes mellitus with hyperlipidemia (HCC) Assessment & Plan: A1c 6.2% today, pretty much as expected after stopping metformin . Continue without medication for this problem, but patient advised to work on strength/resistance training with a recommended starting goal of 2 sessions per week, 20 minutes each.  She is agreeable to this.    Continue rosuvastatin 10 mg, will check lipids at fasting morning visit next time   Orders: -     POCT glycosylated hemoglobin (Hb A1C)     Follow-up 3 months fasting OV DM2, HLD   Rolan Hoyle, PA-C, DMSc, Nutritionist [facility] Primary Care and Sports Medicine [facility] [phone]

## 2024-08-07 NOTE — Assessment & Plan Note (Signed)
 A1c 6.2% today, pretty much as expected after stopping metformin . Continue without medication for this problem, but patient advised to work on strength/resistance training with a recommended starting goal of 2 sessions per week, 20 minutes each.  She is agreeable to this.    Continue rosuvastatin 10 mg, will check lipids at fasting morning visit next time

## 2024-08-08 ENCOUNTER — Encounter: Payer: Self-pay | Admitting: Physician Assistant

## 2024-08-08 DIAGNOSIS — R29818 Other symptoms and signs involving the nervous system: Secondary | ICD-10-CM | POA: Insufficient documentation

## 2024-11-04 ENCOUNTER — Ambulatory Visit: Admitting: Physician Assistant

## 2024-11-05 ENCOUNTER — Ambulatory Visit: Admitting: Physician Assistant

## 2024-11-11 ENCOUNTER — Ambulatory Visit: Admitting: Physician Assistant
